# Patient Record
Sex: Female | Born: 1985 | Race: White | Hispanic: No | Marital: Married | Smoking: Former smoker
Health system: Southern US, Community
[De-identification: ages and names within clinical notes are randomized; demographics above are authoritative.]

## PROBLEM LIST (undated history)

## (undated) ENCOUNTER — Inpatient Hospital Stay (HOSPITAL_COMMUNITY): Payer: Self-pay

## (undated) DIAGNOSIS — T8859XA Other complications of anesthesia, initial encounter: Secondary | ICD-10-CM

## (undated) DIAGNOSIS — F419 Anxiety disorder, unspecified: Secondary | ICD-10-CM

## (undated) DIAGNOSIS — R112 Nausea with vomiting, unspecified: Secondary | ICD-10-CM

## (undated) DIAGNOSIS — K219 Gastro-esophageal reflux disease without esophagitis: Secondary | ICD-10-CM

## (undated) DIAGNOSIS — Z9889 Other specified postprocedural states: Secondary | ICD-10-CM

## (undated) DIAGNOSIS — F429 Obsessive-compulsive disorder, unspecified: Secondary | ICD-10-CM

## (undated) DIAGNOSIS — F3281 Premenstrual dysphoric disorder: Secondary | ICD-10-CM

## (undated) DIAGNOSIS — K828 Other specified diseases of gallbladder: Secondary | ICD-10-CM

## (undated) DIAGNOSIS — T4145XA Adverse effect of unspecified anesthetic, initial encounter: Secondary | ICD-10-CM

## (undated) DIAGNOSIS — I890 Lymphedema, not elsewhere classified: Secondary | ICD-10-CM

## (undated) HISTORY — DX: Other specified diseases of gallbladder: K82.8

## (undated) HISTORY — DX: Anxiety disorder, unspecified: F41.9

## (undated) HISTORY — PX: WISDOM TOOTH EXTRACTION: SHX21

---

## 2009-01-20 ENCOUNTER — Encounter: Admission: RE | Admit: 2009-01-20 | Discharge: 2009-01-20 | Payer: Self-pay | Admitting: Podiatry

## 2009-03-02 ENCOUNTER — Ambulatory Visit: Payer: Self-pay | Admitting: Vascular Surgery

## 2009-04-22 ENCOUNTER — Ambulatory Visit: Payer: Self-pay | Admitting: Vascular Surgery

## 2009-05-06 ENCOUNTER — Encounter: Admission: RE | Admit: 2009-05-06 | Discharge: 2009-05-06 | Payer: Self-pay | Admitting: Vascular Surgery

## 2009-05-06 ENCOUNTER — Ambulatory Visit: Payer: Self-pay | Admitting: Vascular Surgery

## 2009-09-30 ENCOUNTER — Encounter: Admission: RE | Admit: 2009-09-30 | Discharge: 2009-09-30 | Payer: Self-pay | Admitting: Obstetrics and Gynecology

## 2009-12-11 ENCOUNTER — Encounter: Admission: RE | Admit: 2009-12-11 | Discharge: 2009-12-11 | Payer: Self-pay | Admitting: Obstetrics and Gynecology

## 2010-04-09 ENCOUNTER — Encounter
Admission: RE | Admit: 2010-04-09 | Discharge: 2010-04-09 | Payer: Self-pay | Source: Home / Self Care | Attending: Obstetrics and Gynecology | Admitting: Obstetrics and Gynecology

## 2010-04-25 ENCOUNTER — Encounter: Payer: Self-pay | Admitting: Vascular Surgery

## 2010-04-25 ENCOUNTER — Encounter: Payer: Self-pay | Admitting: Podiatry

## 2010-04-25 ENCOUNTER — Encounter (HOSPITAL_COMMUNITY): Payer: Self-pay | Admitting: Obstetrics and Gynecology

## 2010-08-17 NOTE — Assessment & Plan Note (Signed)
OFFICE VISIT   MERISSA, RENWICK  DOB:  [birth year]                                       05/06/2009  [id number]#:[id number]   This patient returns for follow-up today after her CT scan of abdomen  and pelvis.  This showed a small 2 cm functional left ovarian cyst as  well as a 2 cm fibroid of the uterus.  There was no evidence of  lymphadenopathy or lymphatic obstruction in the left groin.  There is no  evidence of venous compression from extrinsic structures.  Overall it  was a  normal CT scan.   The patient has had no changes in her lower extremity symptoms.   Today, on physical exam, blood pressure 121/79 in the left arm, heart  rate is 74 and regular, temperature is 97.4.  Lower extremity exam is  unchanged from January 19.   At this point, I believe that primarily this patient's left foot  swelling is primary lymphedema of the etiology.  I discussed with her  again today using physical therapy intermittently if she needs to for  compression therapy and also continue to wear her compression garment.  She will also refer herself to a doctor immediately if she develops any  infections in her foot as it may be difficult for her to clear these.  She will follow up with me on an as-needed basis.     Janetta Hora. Fields, MD  Electronically Signed   CEF/MEDQ  D:  05/06/2009  T:  05/07/2009  Job:  [id number]   cc:   Fanny Bien. Tuchman, D.P.M.

## 2010-08-17 NOTE — Assessment & Plan Note (Signed)
OFFICE VISIT   Mary Flowers, Mary Flowers  DOB:  03-05-1986                                       04/22/2009  [id number]#:[id number]   CHIEF COMPLAINT:  Left foot swelling.   HISTORY OF PRESENT ILLNESS:  The patient is a 25 year old female  referred by Dr. Leeanne Deed for chronic left lower extremity swelling.  The  patient started having swelling around her left foot and left ankle in  May of 2010.  She has had some relief with compression stockings.  She  states that the swelling becomes worse when she is on her feet all day.  She works as a Emergency planning/management officer.  She denies any prior trauma to the left  leg.  She has no prior history of infections in the left leg.  She  currently is going to physical therapy twice a week where she is doing  alternating warm and cold soaks as well as manual compression therapy.  She denies any prior history of DVT.   She has no significant past medical history.  She has no significant  past surgical history.   FAMILY HISTORY:  Is unremarkable.   SOCIAL HISTORY:  She is married, works as a Emergency planning/management officer.  She drinks  two to three alcoholic beverages per week.  She does not smoke.   REVIEW OF SYSTEMS:  Full 12 point review of systems was performed with  the patient.  Please see intake referral form for details regarding  this.   MEDICATIONS:  She takes no medications.   ALLERGIES:  She has no known drug allergies.   PHYSICAL EXAM:  Vital signs:  Blood pressure is 115/72 in the right arm,  heart rate is 81 regular.  Temperature is 97.6.  HEENT:  Unremarkable.  Neck:  Has 2+ carotid pulses with no JVD or bruit.  Chest:  Clear to  auscultation.  Cardiac:  Exam is regular rate and rhythm without murmur.  Abdomen:  Soft, nontender, nondistended.  No obvious masses.  Extremities:  She has no major skeletal deformities.  She does have  nonpitting edema in the left foot, left ankle and up in the left calf.  There is also some edema in the left  thigh with slight asymmetry of the  left thigh compared to the right.  She has 2+ femoral and dorsalis pedis  pulses bilaterally.  Neurological:  Exam shows symmetric upper extremity  and lower extremity motor strength which is 5/5.  Skin:  Has no ulcers  or rashes.  Musculoskeletal:  Exam shows no obvious major deformities.   She had a venous duplex ultrasound on 03/04/2009 which showed no  evidence of DVT.   She also had several pages of records from Dr. Theotis Burrow office which I  reviewed today.  These are remarkable for normal rheumatoid and  inflammatory factor laboratory testing.  She also had an MRI of the left  foot which showed subcutaneous edema but otherwise was really  unremarkable.   ASSESSMENT:  The patient has left lower extremity swelling which is  chronic in nature.  This most likely represents primary lymphedema of  unknown etiology.  However, since she is fairly young in age I believe  that she should have a CT scan of the abdomen and pelvis to rule out any  extrinsic compression of her venous system or lymphatic system.  We have  scheduled her for CT scan of the abdomen and pelvis with IV and oral  contrast.  I will review the findings once the scan is obtained.  If the  scan shows no significant findings then really her primary mainstay  treatment is going to be continued compression therapy with lymphatic  drainage as she is currently doing with physical therapy.  I discussed  all of these findings with the patient today.  I also discussed with her  that she will be at higher risk for low-grade infections in her foot due  to impaired lymphatic drainage.  I also counseled her that this will  probably be a chronic lifelong problem that can be controlled with the  current interventions that she is utilizing.     Janetta Hora. Fields, MD  Electronically Signed   CEF/MEDQ  D:  04/22/2009  T:  04/23/2009  Job:  [id number]   cc:   Fanny Bien. Tuchman, D.P.M.

## 2010-08-17 NOTE — Procedures (Signed)
DUPLEX DEEP VENOUS EXAM - LOWER EXTREMITY   INDICATION:  Left leg / foot edema.   HISTORY:  Edema:  Left calf and foot swelling for months.  Trauma/Surgery:  No.  Pain:  No.  PE:  No.  Previous DVT:  No.  Anticoagulants:  Other:   DUPLEX EXAM:                CFV   SFV   PopV  PTV    GSV                R  L  R  L  R  L  R   L  R  L  Thrombosis    o  o     o     o      o     o  Spontaneous   +  +     +     +      +     +  Phasic        +  +     +     +      +     +  Augmentation  +  +     +     +      +     +  Compressible  +  +     +     +      +     +  Competent     +  +     +     +      +     +   Legend:  + - yes  o - no  p - partial  D - decreased   IMPRESSION:  1. No evidence of deep venous thrombosis noted in the left lower      extremity.  2. A preliminary report was faxed to Dr. Theotis Burrow office on      03/02/2009.    _____________________________  Janetta Hora. Fields, MD   CH/MEDQ  D:  03/03/2009  T:  03/04/2009  Job:  [id number]

## 2011-03-14 ENCOUNTER — Encounter: Payer: Self-pay | Admitting: Internal Medicine

## 2011-03-14 DIAGNOSIS — F429 Obsessive-compulsive disorder, unspecified: Secondary | ICD-10-CM

## 2011-03-14 DIAGNOSIS — F419 Anxiety disorder, unspecified: Secondary | ICD-10-CM | POA: Insufficient documentation

## 2011-03-14 DIAGNOSIS — F3281 Premenstrual dysphoric disorder: Secondary | ICD-10-CM | POA: Insufficient documentation

## 2011-03-14 DIAGNOSIS — G4700 Insomnia, unspecified: Secondary | ICD-10-CM | POA: Insufficient documentation

## 2011-03-23 ENCOUNTER — Encounter (INDEPENDENT_AMBULATORY_CARE_PROVIDER_SITE_OTHER): Payer: 59 | Admitting: Internal Medicine

## 2011-03-23 DIAGNOSIS — Z0000 Encounter for general adult medical examination without abnormal findings: Secondary | ICD-10-CM

## 2011-03-23 DIAGNOSIS — I890 Lymphedema, not elsewhere classified: Secondary | ICD-10-CM

## 2011-03-23 DIAGNOSIS — F341 Dysthymic disorder: Secondary | ICD-10-CM

## 2011-04-06 ENCOUNTER — Encounter: Payer: Self-pay | Admitting: Emergency Medicine

## 2011-04-06 ENCOUNTER — Emergency Department (HOSPITAL_BASED_OUTPATIENT_CLINIC_OR_DEPARTMENT_OTHER)
Admission: EM | Admit: 2011-04-06 | Discharge: 2011-04-06 | Disposition: A | Discharge status: Home / Self Care | Payer: Worker's Compensation | Attending: Emergency Medicine | Admitting: Emergency Medicine

## 2011-04-06 DIAGNOSIS — S0003XA Contusion of scalp, initial encounter: Secondary | ICD-10-CM | POA: Insufficient documentation

## 2011-04-06 DIAGNOSIS — Y92410 Unspecified street and highway as the place of occurrence of the external cause: Secondary | ICD-10-CM | POA: Insufficient documentation

## 2011-04-06 DIAGNOSIS — F429 Obsessive-compulsive disorder, unspecified: Secondary | ICD-10-CM | POA: Insufficient documentation

## 2011-04-06 HISTORY — DX: Anxiety disorder, unspecified: F41.9

## 2011-04-06 HISTORY — DX: Obsessive-compulsive disorder, unspecified: F42.9

## 2011-04-06 HISTORY — DX: Premenstrual dysphoric disorder: F32.81

## 2011-04-06 MED ORDER — IBUPROFEN 600 MG PO TABS: 600.0000 mg | ORAL_TABLET | Freq: Four times a day (QID) | ORAL | Status: AC | PRN

## 2011-04-06 MED ORDER — METHOCARBAMOL 500 MG PO TABS: ORAL_TABLET | ORAL | Status: AC

## 2011-04-06 MED ORDER — IBUPROFEN 400 MG PO TABS
400.0000 mg | ORAL_TABLET | Freq: Once | ORAL | Status: AC
  Administered 2011-04-06: 400 mg via ORAL
  Filled 2011-04-06: qty 1

## 2011-04-06 MED ORDER — HYDROCODONE-ACETAMINOPHEN 5-325 MG PO TABS
1.0000 | ORAL_TABLET | Freq: Once | ORAL | Status: AC
  Administered 2011-04-06: 1 via ORAL
  Filled 2011-04-06: qty 1
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
## 2011-05-05 ENCOUNTER — Ambulatory Visit (INDEPENDENT_AMBULATORY_CARE_PROVIDER_SITE_OTHER): Payer: 59 | Admitting: Internal Medicine

## 2011-05-05 VITALS — BP 100/62 | HR 92 | Temp 97.2°F | Resp 18 | Ht 67.0 in | Wt 163.0 lb

## 2011-05-05 DIAGNOSIS — E860 Dehydration: Secondary | ICD-10-CM

## 2011-05-05 DIAGNOSIS — F429 Obsessive-compulsive disorder, unspecified: Secondary | ICD-10-CM

## 2011-05-05 DIAGNOSIS — K5289 Other specified noninfective gastroenteritis and colitis: Secondary | ICD-10-CM

## 2011-05-05 DIAGNOSIS — K529 Noninfective gastroenteritis and colitis, unspecified: Secondary | ICD-10-CM

## 2011-05-05 MED ORDER — SODIUM CHLORIDE 0.9 % IV SOLN
4.0000 mg | Freq: Once | INTRAVENOUS | Status: AC
  Administered 2011-05-05: 4 mg via INTRAVENOUS

## 2011-05-05 MED ORDER — ONDANSETRON HCL 4 MG PO TABS: 4.0000 mg | ORAL_TABLET | Freq: Three times a day (TID) | ORAL | Status: AC | PRN

## 2011-05-05 MED ORDER — ONDANSETRON 4 MG PO TBDP: 8.0000 mg | ORAL_TABLET | Freq: Once | ORAL | Status: DC

## 2011-05-05 MED ORDER — FLUOXETINE HCL 40 MG PO CAPS: 80.0000 mg | ORAL_CAPSULE | Freq: Every day | ORAL | Status: DC

## 2011-05-05 MED ORDER — SODIUM CHLORIDE 0.45 % IV SOLN: INTRAVENOUS | Status: DC

## 2011-05-05 NOTE — Progress Notes (Signed)
Mary Flowers came in today with the acute onset of nausea vomiting and diarrhea early this morning. Her husband had the same illness yesterday. There is no known bad food or travel associated with this illness. She has not had fever and she has had no urinary tract symptoms. She has mild abdominal pain that is crampy in nature but goes away with each episode of diarrhea. Her nausea returns almost immediately after vomiting. She is unable to take any liquids by mouth at this point. She feels very weak and dehydrated.  Review of systems-there are no other complaints outside of the gastrointestinal system.  Physical examination. She appears ill but not in acute distress. HEENT clear including no lymphadenopathy. Heart and lungs are normal.  The abdomen is soft and nondistended with no organomegaly. She is mildly tender to deep palpation throughout. The bowel sounds are hyperactive.  Mucous membranes are dry and the skin has mild tenting.  Impression  Problem #1 acute gastroenteritis  Problem #2 mild dehydration  An IV was started and she was given 1000 cc of half normal saline. She felt much improved. She also was given 8 mg of Zofran IV the time of initiating fluids.  She was given a prescription for Zofran 4 mg tablets to take every 8 hours at home if needed.  She was advised to start with clear liquids and advance her diet as tolerated.  She is to check that in the morning at 8 AM if not responding to treatment.  Problem #3 obsessive-compulsive disorder  Since her change from Cymbalta to Prozac she has no symptoms of depression and is or spotting well. She expresses a desire to go ahead and increase her dose to 80 mg because she still has some intrusive thoughts that she is anxious all the time. A prescription was sent to her pharmacy for the new dose and she will follow up with me in one month as planned.

## 2011-05-05 NOTE — Patient Instructions (Signed)
Dehydration Dehydration is the reduction of water and fluid from the body to a level below that required for proper functioning. CAUSES  Dehydration occurs when there is excessive fluid loss from the body or when loss of normal fluids is not adequately replaced. Loss of fluids occurs in vomiting, diarrhea, excessive sweating, excessive urine output, or excessive loss of fluid from the lungs (as occurs in fever or in patients on a ventilator).  Inadequate fluid replacement occurs with nausea or decreased appetite due to illness, sore throat, or mouth pain.  SYMPTOMS  Mild dehydration Thirst (infants and young children may not be able to tell you they are thirsty).  Dry lips.  Slightly dry mouth membranes.  Moderate dehydration Very dry mouth membranes.  Sunken eyes.  Sunken soft spot (fontanelle) on infant's head.  Skin does not bounce back quickly when lightly pinched and released.  Decreased urine production.  Decreased tear production.  Severe dehydration Rapid, weak pulse (more than 100 beats per minute at rest).  Cold hands and feet.  Loss of ability to sweat in spite of heat and temperature.  Rapid breathing.  Blue lips.  Confusion, lethargy, difficult to arouse.  Minimal urine production.  No tears.  DIAGNOSIS  Your caregiver will diagnose dehydration based on your symptoms and your exam. Blood and urine tests will help confirm the diagnosis. The diagnostic evaluation should also identify the cause of dehydration. PREVENTION  The body depends on a proper balance of fluid and salts (electrolytes) for normal function. Adequate fluid intake in the presence of illness or other stresses (such as extreme exercise) is important.  TREATMENT  Mild dehydration is safe to self-treat for most ages as long as it does not worsen. Contact your caregiver for even mild dehydration in infants and the elderly.  In teenagers and adults with moderate dehydration, careful home treatment (as outlined  below) can be safe. Phone contact with a caregiver is advised. Children under 19 years of age with moderate dehydration should see a caregiver.  If you or your child is severely dehydrated, go to a hospital for treatment. Intravenous (IV) fluids will quickly reverse dehydration and are often lifesaving in young children, infants, and elderly persons.  HOME CARE INSTRUCTIONS  Small amounts of fluids should be taken frequently. Large amounts at one time may not be tolerated. Plain water may be harmful in infants and the elderly. Oral rehydration solutions (ORS) are available at pharmacies and grocery stores. ORS replaces water and important electrolytes in proper proportions. Sports drinks are not as effective as ORS and may be harmful because the sugar can make diarrhea worse. As a general guideline for children, replace any new fluid losses from diarrhea and/or vomiting with ORS as follows:  If your child weighs 22 pounds or under (10 kg or less), give 60-120 mL (1/4-1/2 cup or 2-4 ounces) of ORS for each diarrheal stool or vomiting episode.  If your child weighs more than 22 pounds (more than 10 kg), give 120-240 mL (1/2-1 cup or 4-8 ounces) of ORS for each diarrheal stool or vomiting episode.  If your child is vomiting, it may be helpful to give the above ORS replacement in 5 mL (1 teaspoon) amounts every 5 minutes and increase as tolerated.  While correcting for dehydration, children should eat normally. However, foods high in sugar should be avoided because they may worsen diarrhea. Large amounts of carbonated soft drinks, juice, gelatin desserts, and other highly sugared drinks should be avoided.  After correction of  dehydration, other liquids that are appealing to the child may be added. Children should drink small amounts of fluids frequently and fluids should be increased as tolerated. Children should drink enough fluids to keep urine clear or pale yellow.  Adults should eat normally while drinking  more fluids than usual. Drink small amounts of fluids frequently and increase the amount as tolerated. Drink enough fluids to keep urine clear or pale yellow. Broths, weak decaffeinated tea, lemon-lime soft drinks (allowed to go flat), and ORS replace fluids and electrolytes.  Avoid:  Carbonated drinks.  Juice.  Extremely hot or cold fluids.  Caffeine drinks.  Fatty, greasy foods.  Alcohol.  Tobacco.  Too much intake of anything at one time.  Gelatin desserts.  Probiotics are active cultures of beneficial bacteria. They may lessen the amount and number of diarrheal stools in adults. Probiotics can be found in yogurt with active cultures and in supplements.  Wash your hands well to avoid spreading germs (bacteria) and viruses.  Antidiarrheal medicines are not recommended for infants and children.  Only take over-the-counter or prescription medicines for pain, discomfort, or fever as directed by your caregiver. Do not give aspirin to children.  For adults with dehydration, ask your caregiver if you should continue all prescribed and over-the-counter medicines.  If your caregiver has given you a follow-up appointment, it is very important to keep that appointment. Not keeping the appointment could result in a lasting (chronic) or permanent injury and disability. If there is any problem keeping the appointment, you must call to reschedule.  SEEK IMMEDIATE MEDICAL CARE IF:  You are unable to keep fluids down or other symptoms become worse despite treatment.  Vomiting or diarrhea develops and becomes persistent.  There is vomiting of blood or green matter (bile).  There is blood in the stool or the stools are black and tarry.  There is no urine output in 6 to 8 hours or there is only a small amount of very dark urine.  Abdominal pain develops, increases, or localizes.  You or your child has an oral temperature above 102 F (38.9 C), not controlled by medicine.  Your baby is older than 3 months  with a rectal temperature of 102.53F (38.9 C) or higher.  Your baby is 37 months old or younger with a rectal temperature of 100.4 F (38 C) or higher.  You develop excessive weakness, dizziness, fainting, or extreme thirst.  You develop a rash, stiff neck, severe headache, or you become irritable, sleepy, or difficult to awaken.  MAKE SURE YOU:  Understand these instructions.  Will watch your condition.  Will get help right away if you are not doing well or get worse.  Document Released: 03/21/2005 Document Revised: 10/04/2010 Document Reviewed: 02/17/2009 [facility] Patient Information 2012 [place], [place].Norovirus Infection Norovirus illness is caused by a viral infection. The term norovirus refers to a group of viruses. Any of those viruses can cause norovirus illness. This illness is often referred to by other names such as viral gastroenteritis, stomach flu, and food poisoning. Anyone can get a norovirus infection. People can have the illness multiple times during their lifetime. CAUSES  Norovirus is found in the stool or vomit of infected people. It is easily spread from person to person (contagious). People with norovirus are contagious from the moment they begin feeling ill. They may remain contagious for as long as 3 days to 2 weeks after recovery. People can become infected with the virus in several ways. This includes:  Eating  food or drinking liquids that are contaminated with norovirus.   Touching surfaces or objects contaminated with norovirus, and then placing your hand in your mouth.   Having direct contact with a person who is infected and shows symptoms. This may occur while caring for someone with illness or while sharing foods or eating utensils with someone who is ill.  SYMPTOMS  Symptoms usually begin 1 to 2 days after ingestion of the virus. Symptoms may include:  Nausea.   Vomiting.   Diarrhea.   Stomach cramps.   Low-grade fever.   Chills.   Headache.    Muscle aches.   Tiredness.  Most people with norovirus illness get better within 1 to 2 days. Some people become dehydrated because they cannot drink enough liquids to replace those lost from vomiting and diarrhea. This is especially true for young children, the elderly, and others who are unable to care for themselves. DIAGNOSIS  Diagnosis is based on your symptoms and exam. Currently, only state public health laboratories have the ability to test for norovirus in stool or vomit. TREATMENT  No specific treatment exists for norovirus infections. No vaccine is available to prevent infections. Norovirus illness is usually brief in healthy people. If you are ill with vomiting and diarrhea, you should drink enough water and fluids to keep your urine clear or pale yellow. Dehydration is the most serious health effect that can result from this infection. By drinking oral rehydration solution (ORS), people can reduce their chance of becoming dehydrated. There are many commercially available pre-made and powdered ORS designed to safely rehydrate people. These may be recommended by your caregiver. Replace any new fluid losses from diarrhea or vomiting with ORS as follows:  If your child weighs 10 kg or less (22 lb or less), give 60 to 120 ml ( to  cup or 2 to 4 oz) of ORS for each diarrheal stool or vomiting episode.   If your child weighs more than 10 kg (more than 22 lb), give 120 to 240 ml ( to 1 cup or 4 to 8 oz) of ORS for each diarrheal stool or vomiting episode.  HOME CARE INSTRUCTIONS   Follow all your caregiver's instructions.   Avoid sugar-free and alcoholic drinks while ill.   Only take over-the-counter or prescription medicines for pain, vomiting, diarrhea, or fever as directed by your caregiver.  You can decrease your chances of coming in contact with norovirus or spreading it by following these steps:  Frequently wash your hands, especially after using the toilet, changing diapers,  and before eating or preparing food.   Carefully wash fruits and vegetables. Cook shellfish before eating them.   Do not prepare food for others while you are infected and for at least 3 days after recovering from illness.   Thoroughly clean and disinfect contaminated surfaces immediately after an episode of illness using a bleach-based household cleaner.   Immediately remove and wash clothing or linens that may be contaminated with the virus.   Use the toilet to dispose of any vomit or stool. Make sure the surrounding area is kept clean.   Food that may have been contaminated by an ill person should be discarded.  SEEK IMMEDIATE MEDICAL CARE IF:   You develop symptoms of dehydration that do not improve with fluid replacement. This may include:   Excessive sleepiness.   Lack of tears.   Dry mouth.   Dizziness when standing.   Weak pulse.  Document Released: 06/11/2002 Document Revised: 12/01/2010 Document  Reviewed: 07/13/2009 [employer] Patient Information 2012 [place], [place].

## 2011-05-23 ENCOUNTER — Telehealth: Payer: Self-pay

## 2011-05-23 NOTE — Telephone Encounter (Signed)
.  [facility] PT WOULD LIKE A COPY OF HER MEDICAL RECORDS, SHE IS SUING THE DRUG COMPANY AND HER LAWYER WOULD LIKE RECORDS PLEASE CALL [phone] WHEN READY FOR P/U

## 2011-06-27 ENCOUNTER — Ambulatory Visit (INDEPENDENT_AMBULATORY_CARE_PROVIDER_SITE_OTHER): Payer: 59 | Admitting: Internal Medicine

## 2011-06-27 VITALS — BP 108/80 | HR 60 | Temp 98.6°F | Resp 12 | Ht 68.0 in | Wt 158.0 lb

## 2011-06-27 DIAGNOSIS — B354 Tinea corporis: Secondary | ICD-10-CM

## 2011-06-27 DIAGNOSIS — F429 Obsessive-compulsive disorder, unspecified: Secondary | ICD-10-CM

## 2011-06-27 MED ORDER — FLUOXETINE HCL 40 MG PO CAPS: 80.0000 mg | ORAL_CAPSULE | Freq: Every day | ORAL | Status: DC

## 2011-06-27 MED ORDER — CLONAZEPAM 1 MG PO TABS: 1.0000 mg | ORAL_TABLET | Freq: Two times a day (BID) | ORAL | Status: DC | PRN

## 2011-06-27 MED ORDER — CLOTRIMAZOLE-BETAMETHASONE 1-0.05 % EX CREA: TOPICAL_CREAM | Freq: Two times a day (BID) | CUTANEOUS | Status: AC
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
## 2011-07-27 ENCOUNTER — Ambulatory Visit: Payer: 59 | Admitting: Internal Medicine

## 2011-10-05 ENCOUNTER — Ambulatory Visit (INDEPENDENT_AMBULATORY_CARE_PROVIDER_SITE_OTHER): Payer: 59 | Admitting: Family Medicine

## 2011-10-05 VITALS — BP 124/82 | HR 66 | Temp 98.6°F | Resp 16 | Ht 67.0 in | Wt 169.0 lb

## 2011-10-05 DIAGNOSIS — Z23 Encounter for immunization: Secondary | ICD-10-CM

## 2011-10-05 DIAGNOSIS — M6740 Ganglion, unspecified site: Secondary | ICD-10-CM

## 2011-10-05 MED ORDER — TETANUS-DIPHTH-ACELL PERTUSSIS 5-2.5-18.5 LF-MCG/0.5 IM SUSP
0.5000 mL | Freq: Once | INTRAMUSCULAR | Status: AC
  Administered 2011-10-05: 0.5 mL via INTRAMUSCULAR

## 2011-10-05 NOTE — Progress Notes (Signed)
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 She's going back to school to study psychology in the fall.  In addition she has a large ganglion cyst on her left wrist. She's had since childhood. It does not bother her now although it's been inflamed periodically.  Objective: Left wrist shows to 1-1/2 cm fluctuant cyst at the joint line at the base of the thumb.  Assessment: Patient unwilling to have again this is felt this point soreness in his immunizations.  Plan: call for ortho referral prn tdap

## 2011-10-05 NOTE — Patient Instructions (Addendum)
Ganglion A ganglion is a swelling under the skin that is filled with a thick, jelly-like substance. It is a synovial cyst. This is caused by a break (rupture) of the joint lining from the joint space. A ganglion often occurs near an area of repeated minor trauma (damage caused by an accident). Trauma may also be a repetitive movement at work or in a sport. TREATMENT   It often goes away without treatment. It may reappear later. Sometimes a ganglion may need to be surgically removed. Often they are drained and injected with a steroid. Sometimes they respond to:  Rest.   Splinting.  HOME CARE INSTRUCTIONS    Your caregiver will decide the best way of treating your ganglion. Do not try to break the ganglion yourself by pressing on it, poking it with a needle, or hitting it with a heavy object.   Use medications as directed.  SEEK MEDICAL CARE IF:    The ganglion becomes larger or more painful.   You have increased redness or swelling.   You have weakness or numbness in your hand or wrist.  MAKE SURE YOU:    Understand these instructions.   Will watch your condition.   Will get help right away if you are not doing well or get worse.  Document Released: 03/18/2000 Document Revised: 03/10/2011 Document Reviewed: 05/15/2007 Exit[employer] Patient Information 2012 [employer]. 

## 2011-10-21 ENCOUNTER — Other Ambulatory Visit: Payer: Self-pay

## 2011-10-21 ENCOUNTER — Other Ambulatory Visit: Payer: Self-pay | Admitting: Internal Medicine

## 2011-12-06 ENCOUNTER — Other Ambulatory Visit: Payer: Self-pay | Admitting: Family Medicine

## 2011-12-06 ENCOUNTER — Telehealth: Payer: Self-pay | Admitting: Physician Assistant

## 2011-12-06 MED ORDER — CLONAZEPAM 1 MG PO TABS: 1.0000 mg | ORAL_TABLET | Freq: Two times a day (BID) | ORAL | Status: DC | PRN

## 2011-12-06 NOTE — Telephone Encounter (Signed)
Fax request for clonazepam was already addressed electronically today.

## 2011-12-22 ENCOUNTER — Other Ambulatory Visit: Payer: Self-pay | Admitting: Internal Medicine

## 2012-01-06 ENCOUNTER — Other Ambulatory Visit: Payer: Self-pay | Admitting: Physician Assistant

## 2012-01-25 ENCOUNTER — Other Ambulatory Visit: Payer: Self-pay | Admitting: Physician Assistant

## 2012-02-09 ENCOUNTER — Other Ambulatory Visit: Payer: Self-pay | Admitting: Internal Medicine

## 2012-02-11 ENCOUNTER — Other Ambulatory Visit: Payer: Self-pay | Admitting: *Deleted

## 2012-02-16 ENCOUNTER — Telehealth: Payer: Self-pay | Admitting: *Deleted

## 2012-02-16 NOTE — Telephone Encounter (Signed)
Denied - patient needs an OV

## 2012-02-16 NOTE — Telephone Encounter (Signed)
Called patient left message

## 2012-02-16 NOTE — Telephone Encounter (Signed)
PHARMACY REQUESTS REFILL FOR FLUOXETINE 40MG   2 CAPS QD. LAST FILL 02/09/12.  PHARMACY SENDING OUT REQUESTS IN ADVANCE TO BE PROACTIVE

## 2012-02-16 NOTE — Telephone Encounter (Signed)
DISREGARD LAST MESSAGE WRONG MEDICATION

## 2012-02-16 NOTE — Telephone Encounter (Signed)
PHARMACY REQUEST RX REFILL ON PROVENTIL HFA  2 PUFFS EVERY 6 HRS PRN WHEEZING.  LAST FILL 10/29/11

## 2012-03-13 ENCOUNTER — Other Ambulatory Visit: Payer: Self-pay | Admitting: Internal Medicine

## 2012-03-13 NOTE — Telephone Encounter (Signed)
Needs OV.  

## 2012-03-16 ENCOUNTER — Other Ambulatory Visit: Payer: Self-pay | Admitting: Internal Medicine

## 2012-03-17 NOTE — Telephone Encounter (Signed)
Pt needs OV, not seen since March 2013, 4th notice

## 2012-03-30 ENCOUNTER — Telehealth: Payer: Self-pay | Admitting: *Deleted

## 2012-03-30 MED ORDER — NORETHIN-ETH ESTRADIOL-FE 0.8-25 MG-MCG PO CHEW: 1.0000 | CHEWABLE_TABLET | Freq: Every day | ORAL | Status: DC

## 2012-03-30 NOTE — Telephone Encounter (Signed)
Pharmacy requesting a refill on generess fe chewable tabs.  Last refill 02/29/12

## 2012-03-30 NOTE — Telephone Encounter (Signed)
Rx sent. Please advise the patient that she needs an breast exam and pelvic exam.

## 2012-04-01 NOTE — Telephone Encounter (Signed)
Called pt left mssg for her to call back

## 2012-04-02 NOTE — Telephone Encounter (Signed)
lmom that pt needs ov before anymore refills

## 2012-04-04 ENCOUNTER — Ambulatory Visit (INDEPENDENT_AMBULATORY_CARE_PROVIDER_SITE_OTHER): Payer: 59 | Admitting: Internal Medicine

## 2012-04-04 VITALS — BP 129/79 | HR 76 | Temp 99.2°F | Resp 16 | Ht 68.75 in | Wt 170.0 lb

## 2012-04-04 DIAGNOSIS — F411 Generalized anxiety disorder: Secondary | ICD-10-CM

## 2012-04-04 DIAGNOSIS — F429 Obsessive-compulsive disorder, unspecified: Secondary | ICD-10-CM

## 2012-04-04 DIAGNOSIS — J019 Acute sinusitis, unspecified: Secondary | ICD-10-CM

## 2012-04-04 DIAGNOSIS — J329 Chronic sinusitis, unspecified: Secondary | ICD-10-CM

## 2012-04-04 DIAGNOSIS — F419 Anxiety disorder, unspecified: Secondary | ICD-10-CM

## 2012-04-04 MED ORDER — FLUOXETINE HCL 20 MG PO TABS: 60.0000 mg | ORAL_TABLET | Freq: Every day | ORAL | Status: DC

## 2012-04-04 MED ORDER — AMOXICILLIN 875 MG PO TABS: 875.0000 mg | ORAL_TABLET | Freq: Two times a day (BID) | ORAL | Status: DC

## 2012-04-04 MED ORDER — FLUCONAZOLE 150 MG PO TABS: 150.0000 mg | ORAL_TABLET | Freq: Once | ORAL | Status: DC

## 2012-04-04 MED ORDER — CLONAZEPAM 0.5 MG PO TABS: 0.5000 mg | ORAL_TABLET | Freq: Two times a day (BID) | ORAL | Status: DC | PRN

## 2012-04-04 NOTE — Progress Notes (Signed)
  Subjective:    Patient ID: Mary Flowers, female    DOB: 1985/09/29, 27 y.o.   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 Review of Systems No weight loss or fatigue No chest pain or palpitations No genitourinary problems No headaches or vision changes     Objective:   Physical Exam Vital signs stable except weight HEENT: TMs clear/nares with purulent discharge on the left/tender small ulceration on the side of the tongue and the posterior left pharynx/tender left maxillary to percussion No anterior cervical nodes/teeth in good repair/no gum abscesses Heart regular without murmur Lungs clear Abdomen supple Neurological intact       Assessment & Plan:  Problem #1 OCD Problem #2 history of anxiety and insomnia now stable Problem 3 history of PMDD stable Problem #4 other symptoms likely related to the combination of her dose of Prozac, and discontinuation of Klonopin, and a viral upperRI that has progressed to a sinus infection  Decrease Prozac to 60 mg Use occasional doses of 0.5 or .25 Klonopin over the next few weeks as needed, then discontinue Followup 2 weeks if not well otherwise in 6 months  Meds ordered this encounter  Medications  . FLUoxetine (PROZAC) 20 MG tablet    Sig: Take 3 tablets (60 mg total) by mouth daily.    Dispense:  270 tablet    Refill:  3  . clonazePAM (KLONOPIN) 0.5 MG tablet    Sig: Take 1 tablet (0.5 mg total) by mouth 2 (two) times daily as needed for  anxiety.    Dispense:  10 tablet    Refill:  0  . amoxicillin (AMOXIL) 875 MG tablet    Sig: Take 1 tablet (875 mg total) by mouth 2 (two) times daily.    Dispense:  20 tablet    Refill:  0  . fluconazole (DIFLUCAN) 150 MG tablet    Sig: Take 1 tablet (150 mg total) by mouth once.    Dispense:  1 tablet    Refill:  1

## 2012-04-18 ENCOUNTER — Other Ambulatory Visit: Payer: Self-pay | Admitting: Physician Assistant

## 2012-04-20 ENCOUNTER — Telehealth: Payer: Self-pay

## 2012-04-20 NOTE — Telephone Encounter (Signed)
No, I was trying to forward to PA . I have advised patient to take Mucinex and will call back if any further is recommended.

## 2012-04-20 NOTE — Telephone Encounter (Signed)
Did we mean to send this to Dr. Merla Riches?  Will forward. Thanks! JC

## 2012-04-20 NOTE — Telephone Encounter (Signed)
Called patient she states she has been having headache and dizziness, was treated for sinus infection. She still has nasal congestion and I have advised her to start taking Mucinex. Please advise.

## 2012-04-20 NOTE — Telephone Encounter (Signed)
Patient came in for sinus infection, too antibiotics and still has symptoms so she is wondering if there is something else we can prescribe.

## 2012-04-27 ENCOUNTER — Ambulatory Visit: Payer: 59

## 2012-04-27 ENCOUNTER — Telehealth: Payer: Self-pay

## 2012-04-27 DIAGNOSIS — F429 Obsessive-compulsive disorder, unspecified: Secondary | ICD-10-CM

## 2012-04-27 NOTE — Telephone Encounter (Signed)
Patient called last week to ask about getting other antibiotics for sinus infection. Amy talked to her about trying mucinex and would wait to see what PAs say. She has not had further calls and still wants to know what her options are. She is here and would rather not wait to be seen.

## 2012-04-30 NOTE — Telephone Encounter (Signed)
Patient requests this message be sent to Dr Merla Riches, please advise on the dosage of fluoxetine, she decreased to 60 mg but now is asking for increase back to 80mg , please advise

## 2012-04-30 NOTE — Telephone Encounter (Signed)
If the symptoms have persisted this long and are not improving with Mucinex and the antibiotic she had at the beginning of the month, she needs to be re-evaluated.

## 2012-04-30 NOTE — Telephone Encounter (Signed)
Called patient to advise. She states she went to another doctor already. She now is asking about Fluoxetine, wants to know if she can increase the Fluoxetine back to 80mg  dose, she recently decreased to 60mg .

## 2012-04-30 NOTE — Telephone Encounter (Signed)
I would advise her to return and speak with Dr. Merla Riches about adjusting fluoxetine dose.

## 2012-05-01 MED ORDER — FLUOXETINE HCL 40 MG PO CAPS: 40.0000 mg | ORAL_CAPSULE | Freq: Every day | ORAL | Status: DC

## 2012-05-01 NOTE — Telephone Encounter (Signed)
This dose was effective for OCD in past w/out side effects Sent 2x40mg  qd to pharm to fill after uses up current 20mg s at 4x20mg   Meds ordered this encounter  Medications  . FLUoxetine (PROZAC) 40 MG capsule    Sig: Take 1 capsule (40 mg total) by mouth daily.    Dispense:  180 capsule    Refill:  2

## 2012-05-02 MED ORDER — FLUOXETINE HCL 40 MG PO CAPS: 80.0000 mg | ORAL_CAPSULE | Freq: Every day | ORAL | Status: DC

## 2012-05-02 NOTE — Telephone Encounter (Signed)
LMOM for pt notifying her that new Rx was sent in for her to take 2 tablets of Prozac 40 mg QD, and that she may take 4 tabs of her 20 mg she already has until they run out. Asked for CB w/any ?s.

## 2012-05-14 ENCOUNTER — Other Ambulatory Visit: Payer: Self-pay | Admitting: Physician Assistant

## 2012-11-22 ENCOUNTER — Ambulatory Visit (INDEPENDENT_AMBULATORY_CARE_PROVIDER_SITE_OTHER): Payer: 59 | Admitting: Family Medicine

## 2012-11-22 VITALS — BP 102/64 | HR 81 | Temp 98.1°F | Resp 16 | Ht 68.0 in | Wt 147.0 lb

## 2012-11-22 DIAGNOSIS — K2970 Gastritis, unspecified, without bleeding: Secondary | ICD-10-CM

## 2012-11-22 DIAGNOSIS — R1013 Epigastric pain: Secondary | ICD-10-CM

## 2012-11-22 DIAGNOSIS — G4700 Insomnia, unspecified: Secondary | ICD-10-CM

## 2012-11-22 DIAGNOSIS — K2990 Gastroduodenitis, unspecified, without bleeding: Secondary | ICD-10-CM

## 2012-11-22 MED ORDER — RANITIDINE HCL 150 MG PO TABS: 150.0000 mg | ORAL_TABLET | Freq: Every day | ORAL | Status: DC

## 2012-11-22 MED ORDER — HYDROXYZINE HCL 25 MG PO TABS: 25.0000 mg | ORAL_TABLET | Freq: Every evening | ORAL | Status: DC | PRN

## 2012-11-22 MED ORDER — OMEPRAZOLE 20 MG PO CPDR: 20.0000 mg | DELAYED_RELEASE_CAPSULE | Freq: Every day | ORAL | Status: DC

## 2012-11-22 NOTE — Patient Instructions (Addendum)
1.  AVOID IBUPROFEN AT LEAST FOR THE NEXT TWO WEEKS AND THEN USE SPARINGLY. 2.  LIMIT CAFFEINE AND SPICY FOODS. 3.  RETURN FOR WORSENING PAIN OR NO IMPROVEMENT IN ONE MONTH ON MEDICATION.   Gastritis, Adult Gastritis is soreness and swelling (inflammation) of the lining of the stomach. Gastritis can develop as a sudden onset (acute) or long-term (chronic) condition. If gastritis is not treated, it can lead to stomach bleeding and ulcers. CAUSES  Gastritis occurs when the stomach lining is weak or damaged. Digestive juices from the stomach then inflame the weakened stomach lining. The stomach lining may be weak or damaged due to viral or bacterial infections. One common bacterial infection is the Helicobacter pylori infection. Gastritis can also result from excessive alcohol consumption, taking certain medicines, or having too much acid in the stomach.  SYMPTOMS  In some cases, there are no symptoms. When symptoms are present, they may include:  Pain or a burning sensation in the upper abdomen.  Nausea.  Vomiting.  An uncomfortable feeling of fullness after eating. DIAGNOSIS  Your caregiver may suspect you have gastritis based on your symptoms and a physical exam. To determine the cause of your gastritis, your caregiver may perform the following:  Blood or stool tests to check for the H pylori bacterium.  Gastroscopy. A thin, flexible tube (endoscope) is passed down the esophagus and into the stomach. The endoscope has a light and camera on the end. Your caregiver uses the endoscope to view the inside of the stomach.  Taking a tissue sample (biopsy) from the stomach to examine under a microscope. TREATMENT  Depending on the cause of your gastritis, medicines may be prescribed. If you have a bacterial infection, such as an H pylori infection, antibiotics may be given. If your gastritis is caused by too much acid in the stomach, H2 blockers or antacids may be given. Your caregiver may  recommend that you stop taking aspirin, ibuprofen, or other nonsteroidal anti-inflammatory drugs (NSAIDs). HOME CARE INSTRUCTIONS  Only take over-the-counter or prescription medicines as directed by your caregiver.  If you were given antibiotic medicines, take them as directed. Finish them even if you start to feel better.  Drink enough fluids to keep your urine clear or pale yellow.  Avoid foods and drinks that make your symptoms worse, such as:  Caffeine or alcoholic drinks.  Chocolate.  Peppermint or mint flavorings.  Garlic and onions.  Spicy foods.  Citrus fruits, such as oranges, lemons, or limes.  Tomato-based foods such as sauce, chili, salsa, and pizza.  Fried and fatty foods.  Eat small, frequent meals instead of large meals. SEEK IMMEDIATE MEDICAL CARE IF:   You have black or dark red stools.  You vomit blood or material that looks like coffee grounds.  You are unable to keep fluids down.  Your abdominal pain gets worse.  You have a fever.  You do not feel better after 1 week.  You have any other questions or concerns. MAKE SURE YOU:  Understand these instructions.  Will watch your condition.  Will get help right away if you are not doing well or get worse. Document Released: 03/15/2001 Document Revised: 09/20/2011 Document Reviewed: 05/04/2011 [facility] Patient Information 2014 [place], [place].

## 2012-11-22 NOTE — Progress Notes (Signed)
[address]   [place], [place]  [address]   [phone]  Subjective:    Patient ID: Mary Flowers, female    DOB: [birth year], 27 y.o.   MRN: [id number]  HPI This 27 y.o. female presents for evaluation of epigastric pain.  Worried about stomach ulcer.  Took Fluoxetine after a small meal; suffered with stomach burning all night long.  Improved yesterday; ate [place] food last night; has also been taking Ibuprofen for menses, recurrent stomach burning.  Onset four days ago.  +nausea; stomach is really burning; no vomiting; no black stools; no bloody stools.  Daily b.m.  No diarrhea; no constipation.  No dysuria, hematuria, frequency.  LMP currently.  Takes Ibuprofen less often; OCP helps with cramping.  Has suffered with leg pain due to swelling; this has interfered with sleep; taking 3 Ibuprofen every four hours this week.  Spicy foods a lot.  Caffeine regularly; drinks one serving per day.  +fatigued; super tired; shots of expresso this week.  Alcohol weekends.  Took a Tums this week.  Eating can make pain worse.  Similar symptoms in the past but not as severe.  No heartburn or indigestion.   2. Insomnia: has weaned self off sleep medication but struggling to sleep; would like non-habit forming medication for insomnia.  PCP: Merla Riches.   Review of Systems  Constitutional: Negative for chills, diaphoresis and fatigue.  Gastrointestinal: Positive for nausea and abdominal pain. Negative for vomiting, diarrhea, constipation, blood in stool, abdominal distention, anal bleeding and rectal pain.  Genitourinary: Negative for dysuria, urgency, frequency, hematuria, flank pain and pelvic pain.  Psychiatric/Behavioral: Positive for sleep disturbance and dysphoric mood. The patient is nervous/anxious.        Objective:   Physical Exam  Nursing note and vitals reviewed. Constitutional: She is oriented to person, place, and time. She appears well-developed and well-nourished. No distress.  HENT:  Head:  Normocephalic and atraumatic.  Mouth/Throat: Oropharynx is clear and moist.  Eyes: Conjunctivae are normal.  Neck: Neck supple. No thyromegaly present.  Cardiovascular: Normal rate, regular rhythm and normal heart sounds.   No murmur heard. Pulmonary/Chest: Effort normal and breath sounds normal.  Abdominal: Soft. Bowel sounds are normal. She exhibits no distension and no mass. There is tenderness in the epigastric area. There is no rebound and no guarding.  Lymphadenopathy:    She has no cervical adenopathy.  Neurological: She is alert and oriented to person, place, and time.  Skin: Skin is warm and dry. No rash noted. She is not diaphoretic.  Psychiatric: She has a normal mood and affect. Her behavior is normal. Judgment and thought content normal.       Assessment & Plan:  Abdominal pain, epigastric - Plan: omeprazole (PRILOSEC) 20 MG capsule, ranitidine (ZANTAC) 150 MG tablet  Gastritis  Insomnia - Plan: hydrOXYzine (ATARAX/VISTARIL) 25 MG tablet  1.  Abdominal pain epigastric:  New.  Onset in past week; discussed ddx in detail with patient to include gastritis, PUD, GERD.  Pt declined labs.  Rx for Prilosec 20mg  daily provided to take for the next 2-4 weeks.  Rx for Zantac 150mg  qhs to take for the next 2-4 weeks as well. Dietary modification reviewed in detail; to limit/avoid caffeine and spicy foods. Advised to also avoid NSAIDs.  If no improvement in upcoming 3-4 weeks, will need to RTC. 2.  Gastritis:  New.  Likely secondary to excessive NSAID use.  Stop NSAIDs.  Rx for Prilosec and Zantac provided. 3. Insomnia: uncontrolled; rx  for Hydroxozine provided.  Follow-up with Merla Riches.  Meds ordered this encounter  Medications  . norethindrone-ethinyl estradiol (JUNEL FE,GILDESS FE,LOESTRIN FE) 1-20 MG-MCG tablet    Sig: Take 1 tablet by mouth daily.  Marland Kitchen omeprazole (PRILOSEC) 20 MG capsule    Sig: Take 1 capsule (20 mg total) by mouth daily.    Dispense:  30 capsule    Refill:  3   . ranitidine (ZANTAC) 150 MG tablet    Sig: Take 1 tablet (150 mg total) by mouth at bedtime.    Dispense:  30 tablet    Refill:  0  . hydrOXYzine (ATARAX/VISTARIL) 25 MG tablet    Sig: Take 1 tablet (25 mg total) by mouth at bedtime as needed.    Dispense:  30 tablet    Refill:  3

## 2012-12-03 ENCOUNTER — Ambulatory Visit (INDEPENDENT_AMBULATORY_CARE_PROVIDER_SITE_OTHER): Payer: 59 | Admitting: Internal Medicine

## 2012-12-03 VITALS — BP 102/66 | HR 60 | Temp 98.9°F | Resp 16 | Ht 67.5 in | Wt 147.6 lb

## 2012-12-03 DIAGNOSIS — F429 Obsessive-compulsive disorder, unspecified: Secondary | ICD-10-CM

## 2012-12-03 DIAGNOSIS — G4700 Insomnia, unspecified: Secondary | ICD-10-CM

## 2012-12-03 DIAGNOSIS — R1013 Epigastric pain: Secondary | ICD-10-CM

## 2012-12-03 DIAGNOSIS — K921 Melena: Secondary | ICD-10-CM

## 2012-12-03 DIAGNOSIS — I890 Lymphedema, not elsewhere classified: Secondary | ICD-10-CM

## 2012-12-03 DIAGNOSIS — F419 Anxiety disorder, unspecified: Secondary | ICD-10-CM

## 2012-12-03 LAB — POCT CBC
HCT, POC: 42.2 % (ref 37.7–47.9)
Lymph, poc: 2.2 (ref 0.6–3.4)
MCHC: 32.7 g/dL (ref 31.8–35.4)
MID (cbc): 0.3 (ref 0–0.9)
POC Granulocyte: 2.9 (ref 2–6.9)
POC LYMPH PERCENT: 40.4 %L (ref 10–50)
POC MID %: 5.7 %M (ref 0–12)
Platelet Count, POC: 326 10*3/uL (ref 142–424)
RDW, POC: 12.3 %

## 2012-12-03 LAB — COMPREHENSIVE METABOLIC PANEL
Albumin: 4.7 g/dL (ref 3.5–5.2)
Alkaline Phosphatase: 39 U/L (ref 39–117)
BUN: 16 mg/dL (ref 6–23)
Creat: 0.75 mg/dL (ref 0.50–1.10)
Glucose, Bld: 93 mg/dL (ref 70–99)
Total Bilirubin: 0.4 mg/dL (ref 0.3–1.2)

## 2012-12-03 LAB — IFOBT (OCCULT BLOOD): IFOBT: NEGATIVE

## 2012-12-03 MED ORDER — ALBENDAZOLE 200 MG PO TABS: 400.0000 mg | ORAL_TABLET | Freq: Once | ORAL | Status: DC

## 2012-12-03 MED ORDER — FLUOXETINE HCL 40 MG PO CAPS: 80.0000 mg | ORAL_CAPSULE | Freq: Every day | ORAL | Status: DC

## 2012-12-03 NOTE — Progress Notes (Signed)
Subjective:    Patient ID: Mary Flowers, female    DOB: [birth year], 27 y.o.   MRN: [id number]  HPI  Patient states she has had episodes recently with abdominal pain. States she had 1 episode of bright red blood. States also she has seen a worm in her stool that looks like a pencil size segment. She indicates she has dogs which may have exposed her to worms. Denies any itching of anal area. Denies any changes to appetite. Denies constipation. No pain with bowel movements, no history of hemorrhoids.  See last OV. Has had 2 weeks of abdominal pain/ discomfort epigastric area with some improvement with Zantac and Omeprazole.    Patient states she is well with her OCD, taking medications as directed. Sleep OK. Anx controlled.  Patient working full time, as a Emergency planning/management officer,  and in school for undergraduate, majoring in psychology, looking for graduate program currently. ?[employer] and couns psych    Review of Systems No fever chills or night sweats No weight loss Nausea and vomiting No genitourinary symptoms No changes in menses Lymphedema is still a problem /  uses compression stockings  Objective:   Physical Exam BP 102/66  Pulse 60  Temp(Src) 98.9 F (37.2 C) (Oral)  Resp 16  Ht 5' 7.5" (1.715 m)  Wt 147 lb 9.6 oz (66.951 kg)  BMI 22.76 kg/m2  SpO2 99%  LMP 11/21/2012 No acute distress  No thyromegaly or adenopathy Heart regular without murmur Lungs clear Abdomen soft. Some mild tenderness on exam, left lower abdomen. No rebound or guarding. Lymphedema left lower extremity noted, patient using knee high compression hose.( She is to be fitted soon for thigh high support, which would be helpful.) Rectal area clear without fissure or hemorrhoid. Digital exam without masses. Minimal stool heme-negative.     Results for orders placed in visit on 12/03/12  POCT CBC      Result Value Range   WBC 5.4  4.6 - 10.2 K/uL   Lymph, poc 2.2  0.6 - 3.4   POC LYMPH PERCENT 40.4  10 -  50 %L   MID (cbc) 0.3  0 - 0.9   POC MID % 5.7  0 - 12 %M   POC Granulocyte 2.9  2 - 6.9   Granulocyte percent 53.9  37 - 80 %G   RBC 4.35  4.04 - 5.48 M/uL   Hemoglobin 13.8  12.2 - 16.2 g/dL   HCT, POC 40.9  81.1 - 47.9 %   MCV 96.9  80 - 97 fL   MCH, POC 31.7 (*) 27 - 31.2 pg   MCHC 32.7  31.8 - 35.4 g/dL   RDW, POC 91.4     Platelet Count, POC 326  142 - 424 K/uL   MPV 8.9  0 - 99.8 fL  IFOBT (OCCULT BLOOD)      Result Value Range   IFOBT Negative      Assessment & Plan:  Hematochezia -check sedimentation rate Dyspepsia -check CMET/HPylori IgM Lymphedema - OCD (obsessive compulsive disorder) -continue Prozac Anxiety Insomnia ? Worms-empiric albendazole 400 Meds ordered this encounter  Medications  . albendazole (ALBENZA) 200 MG tablet    Sig: Take 2 tablets (400 mg total) by mouth once.    Dispense:  2 tablet    Refill:  0  . FLUoxetine (PROZAC) 40 MG capsule    Sig: Take 2 capsules (80 mg total) by mouth daily.    Dispense:  180 capsule    Refill:  1   followup if bleeding continues

## 2013-01-01 ENCOUNTER — Other Ambulatory Visit: Payer: Self-pay | Admitting: Family Medicine

## 2013-01-23 ENCOUNTER — Telehealth: Payer: Self-pay | Admitting: *Deleted

## 2013-01-23 NOTE — Telephone Encounter (Signed)
Pt received orthotic notice but does not have the amount that has been currently given about $300.00, but was quoted about $150.00 or so.  Would like to speak to someone concerning payment policy.

## 2013-01-23 NOTE — Telephone Encounter (Signed)
[facility] [phone] states pt will benefit from Class III thigh high compression hose for lymphedema.  Dr Irving Shows ordered the recommended compression hose and I faxed to [facility] attn: Robin W.

## 2013-01-25 NOTE — Telephone Encounter (Signed)
[facility] SENDING TO THE BILLING DEPT

## 2013-01-25 NOTE — Telephone Encounter (Signed)
[facility] SENDING MESSAGE TO BILLING DEPT

## 2013-02-17 ENCOUNTER — Encounter (HOSPITAL_BASED_OUTPATIENT_CLINIC_OR_DEPARTMENT_OTHER): Payer: Self-pay | Admitting: Emergency Medicine

## 2013-02-17 ENCOUNTER — Emergency Department (HOSPITAL_BASED_OUTPATIENT_CLINIC_OR_DEPARTMENT_OTHER): Payer: Worker's Compensation

## 2013-02-17 ENCOUNTER — Emergency Department (HOSPITAL_BASED_OUTPATIENT_CLINIC_OR_DEPARTMENT_OTHER)
Admission: EM | Admit: 2013-02-17 | Discharge: 2013-02-17 | Disposition: A | Discharge status: Home / Self Care | Payer: Worker's Compensation | Attending: Emergency Medicine | Admitting: Emergency Medicine

## 2013-02-17 DIAGNOSIS — S6990XA Unspecified injury of unspecified wrist, hand and finger(s), initial encounter: Secondary | ICD-10-CM | POA: Insufficient documentation

## 2013-02-17 DIAGNOSIS — F411 Generalized anxiety disorder: Secondary | ICD-10-CM | POA: Insufficient documentation

## 2013-02-17 DIAGNOSIS — Z79899 Other long term (current) drug therapy: Secondary | ICD-10-CM | POA: Insufficient documentation

## 2013-02-17 DIAGNOSIS — S6991XA Unspecified injury of right wrist, hand and finger(s), initial encounter: Secondary | ICD-10-CM

## 2013-02-17 DIAGNOSIS — W230XXA Caught, crushed, jammed, or pinched between moving objects, initial encounter: Secondary | ICD-10-CM | POA: Insufficient documentation

## 2013-02-17 DIAGNOSIS — S6980XA Other specified injuries of unspecified wrist, hand and finger(s), initial encounter: Secondary | ICD-10-CM | POA: Insufficient documentation

## 2013-02-17 DIAGNOSIS — Y9210 Unspecified residential institution as the place of occurrence of the external cause: Secondary | ICD-10-CM | POA: Insufficient documentation

## 2013-02-17 DIAGNOSIS — F17200 Nicotine dependence, unspecified, uncomplicated: Secondary | ICD-10-CM | POA: Insufficient documentation

## 2013-02-17 DIAGNOSIS — Y9389 Activity, other specified: Secondary | ICD-10-CM | POA: Insufficient documentation

## 2013-02-17 DIAGNOSIS — Z8679 Personal history of other diseases of the circulatory system: Secondary | ICD-10-CM | POA: Insufficient documentation

## 2013-02-17 DIAGNOSIS — F429 Obsessive-compulsive disorder, unspecified: Secondary | ICD-10-CM | POA: Insufficient documentation

## 2013-02-17 HISTORY — DX: Lymphedema, not elsewhere classified: I89.0
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
## 2013-04-16 ENCOUNTER — Ambulatory Visit (INDEPENDENT_AMBULATORY_CARE_PROVIDER_SITE_OTHER): Payer: 59 | Admitting: Family Medicine

## 2013-04-16 VITALS — BP 118/68 | HR 82 | Temp 98.8°F | Resp 17 | Ht 68.0 in | Wt 151.0 lb

## 2013-04-16 DIAGNOSIS — K3189 Other diseases of stomach and duodenum: Secondary | ICD-10-CM

## 2013-04-16 DIAGNOSIS — F429 Obsessive-compulsive disorder, unspecified: Secondary | ICD-10-CM

## 2013-04-16 DIAGNOSIS — R1013 Epigastric pain: Secondary | ICD-10-CM

## 2013-04-16 DIAGNOSIS — Z3200 Encounter for pregnancy test, result unknown: Secondary | ICD-10-CM

## 2013-04-16 LAB — POCT URINE PREGNANCY: Preg Test, Ur: NEGATIVE

## 2013-04-16 MED ORDER — PANTOPRAZOLE SODIUM 40 MG PO TBEC: 40.0000 mg | DELAYED_RELEASE_TABLET | Freq: Every day | ORAL | Status: DC

## 2013-04-16 MED ORDER — FLUOXETINE HCL 20 MG PO CAPS: ORAL_CAPSULE | ORAL | Status: DC

## 2013-04-16 NOTE — Patient Instructions (Addendum)
Let Dr. Merla Richesoolittle know if you become pregnant she can be taken off your  Prozac (fluoxazine)   Use the 20 mg Prozac (fluoxazine)  one daily (20 mg) alternate with 2 daily (40 mg) for 2 or 3 weeks, and if doing well decrease to one daily (20 mg). If continued to do well on the 20 mg for several weeks, speak with Dr. Merla Richesoolittle about giving you some 10 mg pills so you can continue your taper slowly.  Protonix 20 mg one daily for stomach  Referral is being made to a gastroenterologist  Pregnancy test on blood is pending  See Dr. Merla Richesoolittle back in one month

## 2013-04-16 NOTE — Progress Notes (Signed)
Subjective: 28 year old lady who is generally a patient of Dr. Netta Corriganoolittle's. [place]. She is here for several issues:  First, she has been on medication for OCD for a long time. After last visit she cut her self down from 80 mg a day to 40 mg a day. She has a desire to become pregnant. It has been several months since she made that change, and she has adapted satisfactorily. She would like to 20 mg pills to allow her to continue a taper of this.  Secondly, she had been trying to get pregnant. She is not quite a month since her last period, however has tried a home pregnancy test because she also could be pregnant. She got a negative result and would like us to do a serum pregnancy test. I told her we can do that, but that says she hasn't missed a period yet it would probably negative. However since she is trying to get off of her SSRI I will go ahead and do so if needed. First do a urine pregnancy.   Third, she has a long history of intermittent dyspepsia. She thinks she might have an ulcer or something. Dr. Merla Richesoolittle did testing on her about this last visit, and H. pylori was negative. She has tried both omeprazole and ranitidine with some relief. However recently the discomfort is come back, worse the last couple of days, with pain in the hypogastrium. She lost about 30 pounds early last year, and has stabilized since last summer. That was a desired weight loss. She has a bulky dressing on her left leg which she wears for chronic lymphedema. I'm sure that makes him inadequacy of her office weights.  Objective: Healthy-appearing young lady. She apparently had a recent infection of her earlobes from piercing, and took some amoxicillin. The ears look fine. Her TMs are normal. Throat clear. Neck supple without nodes or thyromegaly. Chest clear to auscultation. Heart regular without murmurs. Abdomen soft without masses or tenderness.  Assessment: OCD, tapering medication for  desire pregnancy Attempting pregnancy Status post ear lobe infection Epigastric pain and dyspepsia  Plan: Urine hCG. If negative do serum hCG Refer to gastroenterology Antibiotics 20 mg one daily 20 mg fluoxetine pills so she can taper to Results for orders placed in visit on 04/16/13  POCT URINE PREGNANCY      Result Value Range   Preg Test, Ur Negative

## 2013-04-17 LAB — HCG, SERUM, QUALITATIVE: PREG SERUM: NEGATIVE

## 2013-05-03 ENCOUNTER — Other Ambulatory Visit: Payer: Self-pay | Admitting: Gastroenterology

## 2013-05-03 DIAGNOSIS — R1013 Epigastric pain: Secondary | ICD-10-CM

## 2013-05-16 ENCOUNTER — Ambulatory Visit (HOSPITAL_COMMUNITY): Payer: Self-pay

## 2013-05-20 ENCOUNTER — Encounter (HOSPITAL_COMMUNITY)
Admission: RE | Admit: 2013-05-20 | Discharge: 2013-05-20 | Disposition: A | Discharge status: Home / Self Care | Payer: 59 | Source: Ambulatory Visit | Attending: Gastroenterology | Admitting: Gastroenterology

## 2013-05-20 ENCOUNTER — Ambulatory Visit (HOSPITAL_COMMUNITY)
Admission: RE | Admit: 2013-05-20 | Discharge: 2013-05-20 | Disposition: A | Discharge status: Home / Self Care | Payer: 59 | Source: Ambulatory Visit | Attending: Gastroenterology | Admitting: Gastroenterology

## 2013-05-20 DIAGNOSIS — R1013 Epigastric pain: Secondary | ICD-10-CM

## 2013-05-20 DIAGNOSIS — R109 Unspecified abdominal pain: Secondary | ICD-10-CM | POA: Insufficient documentation

## 2013-05-20 DIAGNOSIS — R112 Nausea with vomiting, unspecified: Secondary | ICD-10-CM | POA: Insufficient documentation

## 2013-05-20 MED ORDER — SINCALIDE 5 MCG IJ SOLR
INTRAMUSCULAR | Status: AC
  Administered 2013-05-20: 10:00:00 5 ug via INTRAVENOUS
  Filled 2013-05-20: qty 5

## 2013-05-20 MED ORDER — TECHNETIUM TC 99M MEBROFENIN IV KIT
5.0000 | PACK | Freq: Once | INTRAVENOUS | Status: AC | PRN
  Administered 2013-05-20: 5 via INTRAVENOUS

## 2013-05-20 MED ORDER — SINCALIDE 5 MCG IJ SOLR
0.0200 ug/kg | Freq: Once | INTRAMUSCULAR | Status: AC
  Administered 2013-05-20: 5 ug via INTRAVENOUS

## 2013-05-24 ENCOUNTER — Other Ambulatory Visit: Payer: Self-pay | Admitting: Gastroenterology

## 2013-05-24 ENCOUNTER — Ambulatory Visit
Admission: RE | Admit: 2013-05-24 | Discharge: 2013-05-24 | Disposition: A | Discharge status: Home / Self Care | Payer: 59 | Source: Ambulatory Visit | Attending: Gastroenterology | Admitting: Gastroenterology

## 2013-05-24 DIAGNOSIS — R1013 Epigastric pain: Secondary | ICD-10-CM

## 2013-05-24 DIAGNOSIS — K625 Hemorrhage of anus and rectum: Secondary | ICD-10-CM

## 2013-05-24 MED ORDER — IOHEXOL 300 MG/ML  SOLN
100.0000 mL | Freq: Once | INTRAMUSCULAR | Status: AC | PRN
  Administered 2013-05-24: 100 mL via INTRAVENOUS

## 2013-05-24 MED ORDER — IOHEXOL 300 MG/ML  SOLN
30.0000 mL | Freq: Once | INTRAMUSCULAR | Status: AC | PRN
  Administered 2013-05-24: 30 mL via ORAL

## 2013-05-29 DIAGNOSIS — M722 Plantar fascial fibromatosis: Secondary | ICD-10-CM

## 2013-06-03 ENCOUNTER — Ambulatory Visit (INDEPENDENT_AMBULATORY_CARE_PROVIDER_SITE_OTHER): Payer: 59 | Admitting: General Surgery

## 2013-06-03 ENCOUNTER — Encounter (INDEPENDENT_AMBULATORY_CARE_PROVIDER_SITE_OTHER): Payer: Self-pay | Admitting: General Surgery

## 2013-06-03 VITALS — BP 102/78 | HR 86 | Temp 98.1°F | Resp 14

## 2013-06-03 DIAGNOSIS — K811 Chronic cholecystitis: Secondary | ICD-10-CM

## 2013-06-03 DIAGNOSIS — K828 Other specified diseases of gallbladder: Secondary | ICD-10-CM | POA: Insufficient documentation

## 2013-06-03 NOTE — Patient Instructions (Signed)

## 2013-06-03 NOTE — Assessment & Plan Note (Signed)
Patient does appear to have biliary source of pain given the fact that she has postprandial attacks of epigastric pain, worsening with fatty foods, and pain with CCK injection.  She does not have significant tenderness in her right upper quadrant, but given the other factors, I think that she would benefit from a cholecystectomy.  I reviewed this with the patient. I discussed that when all of her imaging studies appear normal, there is a chance that she would not improve with cholecystectomy.  The surgical procedure was described to the patient in detail.  The patient was given Agricultural engineereducational material. .  I discussed the incision type and location, the location of the gallbladder, the anatomy of the bile ducts and arteries, and the typical progression of surgery.  I discussed the possibility of converting to an open operation.  I advised of the risks of bleeding, infection, damage to other structures (such as the bile duct, intestine or liver), bile leak, need for other procedures or surgeries, and post op diarrhea/constipation.  We discussed the risk of blood clot.  We discussed the recovery period and post operative restrictions.  The patient was advised against taking blood thinners the week before surgery.

## 2013-06-03 NOTE — Progress Notes (Signed)
Chief Complaint  Patient presents with  . Biliary Dyskinesia    HISTORY: Patient is a 28 year old female has had approximately 5 months of epigastric abdominal pain. She she had an ulcer. She went to see GI. She took proton pump inhibitors for approximately 2-4 weeks. She did not notice significant improvement. She has some chronic epigastric discomfort, but primarily has episodic attacks of severe pain, nausea, and vomiting. These are mostly after she ears, around 30-45 minutes. She also noticed worsening with fatty foods. Since she has been eating mostly smoothies and low-fat food, she has had slightly fewer attacks.  She also states that she intermittently has a light-colored stool intermittently has fevers. She has some gassy sensation with occasional loose stools and diarrhea. She denies family history of gallstones. She has undergone ultrasound, which was negative. She's also had a CT scan which showed some slightly prominent mesenteric lymph nodes, and has had a normal EGD. Her HIDA scan showed a normal ejection fraction, but she did have replication of her symptoms with injection of CCK.  Past Medical History  Diagnosis Date  . OCD (obsessive compulsive disorder)   . Anxiety disorder   . PMDD (premenstrual dysphoric disorder)   . Anxiety   . Lymph edema LLE  . Biliary dyskinesia     History reviewed. No pertinent past surgical history.  Current Outpatient Prescriptions  Medication Sig Dispense Refill  . ibuprofen (ADVIL,MOTRIN) 200 MG tablet Take 200 mg by mouth every 6 (six) hours as needed.      . Prenatal Multivit-Min-Fe-FA (PRENATAL VITAMINS PO) Take by mouth daily.      . promethazine (PHENERGAN) 12.5 MG suppository Place 12.5 mg rectally every 6 (six) hours as needed for nausea or vomiting.       No current facility-administered medications for this visit.     No Known Allergies   Family History  Problem Relation Age of Onset  . Cancer Maternal Grandmother   .  Diabetes Maternal Grandfather   . Heart disease Paternal Grandfather   . Stroke Paternal Grandfather      History   Social History  . Marital Status: Married    Spouse Name: N/A    Number of Children: N/A  . Years of Education: N/A   Social History Main Topics  . Smoking status: Current Some Day Smoker    Types: Cigarettes  . Smokeless tobacco: Never Used  . Alcohol Use: 0.6 oz/week    1 Glasses of wine per week     Comment: daily wine use 1 glass  . Drug Use: No  . Sexual Activity: None   Other Topics Concern  . None- [place] from [place]   Social History Narrative  . Emergency planning/management officerolice officer, trying to get pregnant.       REVIEW OF SYSTEMS - PERTINENT POSITIVES ONLY: 12 point review of systems negative other than HPI and PMH except for intermittent fevers, left leg lymphedema, headaches  EXAM: Filed Vitals:   06/03/13 1030  BP: 102/78  Pulse: 86  Temp: 98.1 F (36.7 C)  Resp: 14    Wt Readings from Last 3 Encounters:  04/16/13 151 lb (68.493 kg)  02/17/13 145 lb (65.772 kg)  12/03/12 147 lb 9.6 oz (66.951 kg)     Gen:  No acute distress.  Well nourished and well groomed.   Neurological: Alert and oriented to person, place, and time. Coordination normal.  Head: Normocephalic and atraumatic.  Eyes: Conjunctivae are normal. Pupils are equal, round, and reactive to light.  No scleral icterus.  Neck: Normal range of motion. Neck supple. No tracheal deviation or thyromegaly present.  Cardiovascular: Normal rate, regular rhythm, normal heart sounds and intact distal pulses.  Exam reveals no gallop and no friction rub.  No murmur heard. Respiratory: Effort normal.  No respiratory distress. No chest wall tenderness. Breath sounds normal.  No wheezes, rales or rhonchi.  GI: Soft. Bowel sounds are normal. The abdomen is soft and nontender.  There is no rebound and no guarding.  Musculoskeletal: Normal range of motion. Extremities are nontender.  Lymphadenopathy: No  cervical, preauricular, postauricular or axillary adenopathy is present Skin: Skin is warm and dry. No rash noted. No diaphoresis. No erythema. No pallor. No clubbing, cyanosis, or edema.   Psychiatric: Normal mood and affect. Behavior is normal. Judgment and thought content normal.    LABORATORY RESULTS: Available labs are reviewed   Recent Results (from the past 2160 hour(s))  POCT URINE PREGNANCY     Status: None   Collection Time    04/16/13  4:42 PM      Result Value Ref Range   Preg Test, Ur Negative    HCG, SERUM, QUALITATIVE     Status: None   Collection Time    04/16/13  4:48 PM      Result Value Ref Range   Preg, Serum NEG       RADIOLOGY RESULTS: See E-Chart or I-Site for most recent results.  Images and reports are reviewed.  US Abdomen Complete  05/20/2013   CLINICAL DATA:  Epigastric pain  EXAM: ULTRASOUND ABDOMEN COMPLETE  COMPARISON:  CT abdomen and pelvis April 05, 2009  FINDINGS: Gallbladder:  No gallstones or wall thickening visualized. There is no pericholecystic fluid. No sonographic Murphy sign noted.  Common bile duct:  Diameter: 5 mm. There is no intrahepatic, common hepatic, or common bile duct dilatation.  Liver:  No focal lesion identified. Within normal limits in parenchymal echogenicity.  IVC:  No abnormality visualized.  Pancreas:  No mass or inflammatory focus.  Spleen:  Size and appearance within normal limits.  Right Kidney:  Length: 11.1 cm. Echogenicity within normal limits. No mass or hydronephrosis visualized.  Left Kidney:  Length: 11.4 cm. Echogenicity within normal limits. No mass or hydronephrosis visualized.  Abdominal aorta:  No aneurysm visualized.  Other findings:  There is no demonstrable ascites.  IMPRESSION: Study within normal limits.   Electronically Signed   By: Bretta Bang M.D.   On: 05/20/2013 08:49   Ct Abdomen Pelvis W Contrast  05/24/2013   CLINICAL DATA:  Epigastric pain, fever and rectal bleeding  EXAM: CT ABDOMEN AND  PELVIS WITH CONTRAST  TECHNIQUE: Multidetector CT imaging of the abdomen and pelvis was performed using the standard protocol following bolus administration of intravenous contrast.  CONTRAST:  100 cc of Omni 300  COMPARISON:  05/06/2009  FINDINGS: The lung bases are clear.  No pleural or pericardial effusion.  No focal liver abnormality identified. The gallbladder appears normal. No biliary dilatation. Normal appearance of the pancreas. The spleen appears normal.  The adrenal glands are both normal. The kidneys are both on unremarkable. The urinary bladder appears normal. The uterus and adnexal structures are on unremarkable.  Normal caliber of the abdominal aorta. There is no aneurysm. Multiple small jejunal and ileocolic mesenteric lymph nodes are identified. There is no adenopathy. No pelvic or inguinal adenopathy identified. A small amount of free fluid is identified within the cul-de-sac. No focal fluid collections identified.  The stomach  is normal. The small bowel loops have a normal course and caliber without evidence for bowel obstruction. Normal appearance of the colon. The appendix is visualized and appears normal.  Review of the visualized osseous structures is on unremarkable. There are no aggressive lytic or sclerotic bone lesions identified.  IMPRESSION: 1. Multiple small (sub cm) mesenteric lymph nodes are identified without adenopathy. Correlate for any clinical signs or symptoms of mesenteric adenitis. 2. Small amount of free fluid is noted within the dependent portion of the pelvis. 3. No evidence for bowel obstruction, bowel perforation or abscess formation. 4. The appendix is visualized and appears normal.   Electronically Signed   By: Signa Kell M.D.   On: 05/24/2013 16:50   Nm Hepato W/eject Fract  05/20/2013   CLINICAL DATA:  Abdominal pain with nausea and vomiting  EXAM: NUCLEAR MEDICINE HEPATOBILIARY IMAGING WITH GALLBLADDER EF  Views:  Anterior, right lateral right upper quadrant   Radionuclide: Technetium 52m Choletec  Dose:  5.0 mCi  Route of administration: Intravenous  COMPARISON:  Abdominal ultrasound May 20, 2013  FINDINGS: Liver uptake of radiotracer is normal. There is prompt visualization of gallbladder and small bowel, indicating patency of the cystic and common bile ducts.  A weight based dose, 1.36 mcg, of CCK was administered intravenously with calculation of the computer generated ejection fraction of radiotracer from the gallbladder. The patient did experience pain with intravenous CCK administration. The ejection fraction of radiotracer from the gallbladder is measured at 48.3%, normal greater than 38%.  IMPRESSION: Normal ejection fraction of radiotracer from the gallbladder. Cystic and common bile ducts are patent. Patient did experience pain with intravenous CCK administration.   Electronically Signed   By: Bretta Bang M.D.   On: 05/20/2013 11:02      ASSESSMENT AND PLAN: Biliary dyskinesia Patient does appear to have biliary source of pain given the fact that she has postprandial attacks of epigastric pain, worsening with fatty foods, and pain with CCK injection.  She does not have significant tenderness in her right upper quadrant, but given the other factors, I think that she would benefit from a cholecystectomy.  I reviewed this with the patient. I discussed that when all of her imaging studies appear normal, there is a chance that she would not improve with cholecystectomy.  The surgical procedure was described to the patient in detail.  The patient was given Agricultural engineer. .  I discussed the incision type and location, the location of the gallbladder, the anatomy of the bile ducts and arteries, and the typical progression of surgery.  I discussed the possibility of converting to an open operation.  I advised of the risks of bleeding, infection, damage to other structures (such as the bile duct, intestine or liver), bile leak, need for  other procedures or surgeries, and post op diarrhea/constipation.  We discussed the risk of blood clot.  We discussed the recovery period and post operative restrictions.  The patient was advised against taking blood thinners the week before surgery.           Maudry Diego MD Surgical Oncology, General and Endocrine Surgery [facility]      Visit Diagnoses: 1. Chronic cholecystitis     Primary Care Physician: Tonye Pearson, MD

## 2013-06-10 ENCOUNTER — Encounter (HOSPITAL_COMMUNITY)
Admission: RE | Admit: 2013-06-10 | Discharge: 2013-06-10 | Disposition: A | Discharge status: Home / Self Care | Payer: 59 | Source: Ambulatory Visit | Attending: General Surgery | Admitting: General Surgery

## 2013-06-10 ENCOUNTER — Encounter (HOSPITAL_COMMUNITY): Payer: Self-pay

## 2013-06-10 HISTORY — DX: Gastro-esophageal reflux disease without esophagitis: K21.9

## 2013-06-10 HISTORY — DX: Other specified postprocedural states: Z98.890

## 2013-06-10 HISTORY — DX: Nausea with vomiting, unspecified: R11.2

## 2013-06-10 HISTORY — DX: Adverse effect of unspecified anesthetic, initial encounter: T41.45XA

## 2013-06-10 HISTORY — DX: Other complications of anesthesia, initial encounter: T88.59XA

## 2013-06-10 LAB — CBC WITH DIFFERENTIAL/PLATELET
Basophils Absolute: 0.1 10*3/uL (ref 0.0–0.1)
Basophils Relative: 1 % (ref 0–1)
Eosinophils Absolute: 0.4 10*3/uL (ref 0.0–0.7)
Eosinophils Relative: 6 % — ABNORMAL HIGH (ref 0–5)
HCT: 38.4 % (ref 36.0–46.0)
Hemoglobin: 13.4 g/dL (ref 12.0–15.0)
LYMPHS PCT: 43 % (ref 12–46)
Lymphs Abs: 2.3 10*3/uL (ref 0.7–4.0)
MCH: 31.1 pg (ref 26.0–34.0)
MCHC: 34.9 g/dL (ref 30.0–36.0)
MCV: 89.1 fL (ref 78.0–100.0)
Monocytes Absolute: 0.4 10*3/uL (ref 0.1–1.0)
Monocytes Relative: 7 % (ref 3–12)
Neutro Abs: 2.4 10*3/uL (ref 1.7–7.7)
Neutrophils Relative %: 43 % (ref 43–77)
PLATELETS: 268 10*3/uL (ref 150–400)
RBC: 4.31 MIL/uL (ref 3.87–5.11)
RDW: 11.7 % (ref 11.5–15.5)
WBC: 5.4 10*3/uL (ref 4.0–10.5)

## 2013-06-10 LAB — COMPREHENSIVE METABOLIC PANEL
ALT: 23 U/L (ref 0–35)
AST: 22 U/L (ref 0–37)
Albumin: 4 g/dL (ref 3.5–5.2)
Alkaline Phosphatase: 59 U/L (ref 39–117)
BUN: 16 mg/dL (ref 6–23)
CHLORIDE: 105 meq/L (ref 96–112)
CO2: 24 meq/L (ref 19–32)
Calcium: 9.6 mg/dL (ref 8.4–10.5)
Creatinine, Ser: 0.75 mg/dL (ref 0.50–1.10)
GFR calc Af Amer: >90 mL/min (ref >90)
Glucose, Bld: 80 mg/dL (ref 70–99)
POTASSIUM: 4.5 meq/L (ref 3.7–5.3)
Sodium: 142 mEq/L (ref 137–147)
Total Bilirubin: 0.3 mg/dL (ref 0.3–1.2)
Total Protein: 6.7 g/dL (ref 6.0–8.3)

## 2013-06-10 LAB — PROTIME-INR
INR: 1.06 (ref 0.00–1.49)
Prothrombin Time: 13.6 seconds (ref 11.6–15.2)

## 2013-06-10 LAB — APTT: APTT: 28 s (ref 24–37)

## 2013-06-10 LAB — HCG, SERUM, QUALITATIVE: Preg, Serum: NEGATIVE

## 2013-06-10 NOTE — Pre-Procedure Instructions (Addendum)
Mary Flowers  06/10/2013   Your procedure is scheduled on:  Thursday, March 12.  [facility], Main Entrance or Entrance "A" at 11:00 AM.  Call this number if you have problems the morning of surgery: [phone]   Remember:   Do not eat food or drink liquids after midnight Wednesday.    Take these medicines the morning of surgery with A SIP OF WATER: None             Take if needed:promethazine (PHENERGAN)  Stop taking Aspirin, Coumadin, Plavix, Effient and Herbal medications.  Do not take any NSAIDs ie: Ibuprofen,  Advil,Naproxen or any medication containing Aspirin.   Do not wear jewelry, make-up or nail polish.  Do not wear lotions, powders, or perfumes.   Do not shave 48 hours prior to surgery.   Do not bring valuables to the hospital.  [facility] is not responsible   for any belongings or valuables.               Contacts, dentures or bridgework may not be worn into surgery.  Leave suitcase in the car. After surgery it may be brought to your room.  For patients admitted to the hospital, discharge time is determined by your treatment team.               Patients discharged the day of surgery will not be allowed to drive home.  Name and phone number of your driver: -  Special Instructions: Review  [place] - Preparing For Surgery.   Please read over the following fact sheets that you were given: Pain Booklet, Coughing and Deep Breathing and Surgical Site Infection Prevention

## 2013-06-12 MED ORDER — CEFAZOLIN SODIUM-DEXTROSE 2-3 GM-% IV SOLR
2.0000 g | INTRAVENOUS | Status: AC
  Administered 2013-06-13: 2 g via INTRAVENOUS
  Filled 2013-06-12: qty 50

## 2013-06-13 ENCOUNTER — Encounter (HOSPITAL_COMMUNITY): Payer: Self-pay | Admitting: Surgery

## 2013-06-13 ENCOUNTER — Encounter (HOSPITAL_COMMUNITY): Payer: 59 | Admitting: Anesthesiology

## 2013-06-13 ENCOUNTER — Ambulatory Visit (HOSPITAL_COMMUNITY)
Admission: RE | Admit: 2013-06-13 | Discharge: 2013-06-13 | Disposition: A | Discharge status: Home / Self Care | Payer: 59 | Source: Ambulatory Visit | Attending: General Surgery | Admitting: General Surgery

## 2013-06-13 ENCOUNTER — Encounter (HOSPITAL_COMMUNITY)
Admission: RE | Disposition: A | Discharge status: Home / Self Care | Payer: Self-pay | Source: Ambulatory Visit | Attending: General Surgery

## 2013-06-13 ENCOUNTER — Ambulatory Visit (HOSPITAL_COMMUNITY): Payer: 59 | Admitting: Anesthesiology

## 2013-06-13 DIAGNOSIS — F329 Major depressive disorder, single episode, unspecified: Secondary | ICD-10-CM | POA: Insufficient documentation

## 2013-06-13 DIAGNOSIS — F3289 Other specified depressive episodes: Secondary | ICD-10-CM | POA: Insufficient documentation

## 2013-06-13 DIAGNOSIS — F429 Obsessive-compulsive disorder, unspecified: Secondary | ICD-10-CM | POA: Insufficient documentation

## 2013-06-13 DIAGNOSIS — F17200 Nicotine dependence, unspecified, uncomplicated: Secondary | ICD-10-CM | POA: Insufficient documentation

## 2013-06-13 DIAGNOSIS — K219 Gastro-esophageal reflux disease without esophagitis: Secondary | ICD-10-CM | POA: Insufficient documentation

## 2013-06-13 DIAGNOSIS — K811 Chronic cholecystitis: Secondary | ICD-10-CM

## 2013-06-13 DIAGNOSIS — K828 Other specified diseases of gallbladder: Secondary | ICD-10-CM | POA: Insufficient documentation

## 2013-06-13 DIAGNOSIS — F411 Generalized anxiety disorder: Secondary | ICD-10-CM | POA: Insufficient documentation

## 2013-06-13 HISTORY — PX: CHOLECYSTECTOMY: SHX55

## 2013-06-13 SURGERY — LAPAROSCOPIC CHOLECYSTECTOMY
Anesthesia: General | Site: Abdomen

## 2013-06-13 MED ORDER — HYDROMORPHONE HCL PF 1 MG/ML IJ SOLN
INTRAMUSCULAR | Status: AC
  Filled 2013-06-13: qty 1

## 2013-06-13 MED ORDER — FENTANYL CITRATE 0.05 MG/ML IJ SOLN
INTRAMUSCULAR | Status: DC | PRN
  Administered 2013-06-13 (×2): 100 ug via INTRAVENOUS

## 2013-06-13 MED ORDER — PROMETHAZINE HCL 25 MG/ML IJ SOLN
12.5000 mg | Freq: Four times a day (QID) | INTRAMUSCULAR | Status: DC | PRN
  Administered 2013-06-13: 12.5 mg via INTRAVENOUS

## 2013-06-13 MED ORDER — PROPOFOL 10 MG/ML IV BOLUS
INTRAVENOUS | Status: AC
  Filled 2013-06-13: qty 20

## 2013-06-13 MED ORDER — OXYCODONE HCL 5 MG PO TABS: 5.0000 mg | ORAL_TABLET | ORAL | Status: DC | PRN

## 2013-06-13 MED ORDER — MIDAZOLAM HCL 5 MG/5ML IJ SOLN
INTRAMUSCULAR | Status: DC | PRN
  Administered 2013-06-13: 2 mg via INTRAVENOUS

## 2013-06-13 MED ORDER — ACETAMINOPHEN 650 MG RE SUPP: 650.0000 mg | RECTAL | Status: DC | PRN

## 2013-06-13 MED ORDER — PROMETHAZINE HCL 25 MG/ML IJ SOLN
INTRAMUSCULAR | Status: AC
  Filled 2013-06-13: qty 1

## 2013-06-13 MED ORDER — NEOSTIGMINE METHYLSULFATE 1 MG/ML IJ SOLN
INTRAMUSCULAR | Status: AC
  Filled 2013-06-13: qty 10

## 2013-06-13 MED ORDER — OXYCODONE-ACETAMINOPHEN 5-325 MG PO TABS: 1.0000 | ORAL_TABLET | ORAL | Status: DC | PRN

## 2013-06-13 MED ORDER — GLYCOPYRROLATE 0.2 MG/ML IJ SOLN
INTRAMUSCULAR | Status: DC | PRN
  Administered 2013-06-13: 0.6 mg via INTRAVENOUS

## 2013-06-13 MED ORDER — HYDROMORPHONE HCL PF 1 MG/ML IJ SOLN
0.2500 mg | INTRAMUSCULAR | Status: DC | PRN
  Administered 2013-06-13 (×2): 0.5 mg via INTRAVENOUS

## 2013-06-13 MED ORDER — LIDOCAINE HCL (CARDIAC) 20 MG/ML IV SOLN
INTRAVENOUS | Status: DC | PRN
  Administered 2013-06-13: 80 mg via INTRAVENOUS

## 2013-06-13 MED ORDER — NEOSTIGMINE METHYLSULFATE 1 MG/ML IJ SOLN
INTRAMUSCULAR | Status: DC | PRN
  Administered 2013-06-13: 4 mg via INTRAVENOUS

## 2013-06-13 MED ORDER — ARTIFICIAL TEARS OP OINT
TOPICAL_OINTMENT | OPHTHALMIC | Status: DC | PRN
  Administered 2013-06-13: 1 via OPHTHALMIC

## 2013-06-13 MED ORDER — ONDANSETRON HCL 4 MG/2ML IJ SOLN
INTRAMUSCULAR | Status: DC | PRN
  Administered 2013-06-13 (×2): 4 mg via INTRAVENOUS

## 2013-06-13 MED ORDER — MIDAZOLAM HCL 2 MG/2ML IJ SOLN
INTRAMUSCULAR | Status: AC
  Filled 2013-06-13: qty 2

## 2013-06-13 MED ORDER — LACTATED RINGERS IV SOLN
INTRAVENOUS | Status: DC | PRN
  Administered 2013-06-13 (×2): via INTRAVENOUS

## 2013-06-13 MED ORDER — LACTATED RINGERS IV SOLN
INTRAVENOUS | Status: DC
  Administered 2013-06-13: 12:00:00 via INTRAVENOUS

## 2013-06-13 MED ORDER — ACETAMINOPHEN 325 MG PO TABS: 650.0000 mg | ORAL_TABLET | ORAL | Status: DC | PRN

## 2013-06-13 MED ORDER — FENTANYL CITRATE 0.05 MG/ML IJ SOLN
INTRAMUSCULAR | Status: AC
  Filled 2013-06-13: qty 5

## 2013-06-13 MED ORDER — PROPOFOL 10 MG/ML IV BOLUS
INTRAVENOUS | Status: DC | PRN
  Administered 2013-06-13: 150 mg via INTRAVENOUS

## 2013-06-13 MED ORDER — LIDOCAINE HCL 1 % IJ SOLN
INTRAMUSCULAR | Status: DC | PRN
  Administered 2013-06-13: 14:00:00

## 2013-06-13 MED ORDER — ARTIFICIAL TEARS OP OINT
TOPICAL_OINTMENT | OPHTHALMIC | Status: AC
  Filled 2013-06-13: qty 3.5

## 2013-06-13 MED ORDER — CHLORHEXIDINE GLUCONATE 4 % EX LIQD: 1.0000 "application " | Freq: Once | CUTANEOUS | Status: DC

## 2013-06-13 MED ORDER — SODIUM CHLORIDE 0.9 % IR SOLN
Status: DC | PRN
  Administered 2013-06-13: 1

## 2013-06-13 MED ORDER — SODIUM CHLORIDE 0.9 % IV SOLN: 250.0000 mL | INTRAVENOUS | Status: DC | PRN

## 2013-06-13 MED ORDER — ONDANSETRON HCL 4 MG/2ML IJ SOLN: 4.0000 mg | Freq: Four times a day (QID) | INTRAMUSCULAR | Status: DC | PRN

## 2013-06-13 MED ORDER — SODIUM CHLORIDE 0.9 % IJ SOLN: 3.0000 mL | INTRAMUSCULAR | Status: DC | PRN

## 2013-06-13 MED ORDER — GLYCOPYRROLATE 0.2 MG/ML IJ SOLN
INTRAMUSCULAR | Status: AC
  Filled 2013-06-13: qty 2

## 2013-06-13 MED ORDER — DEXAMETHASONE SODIUM PHOSPHATE 4 MG/ML IJ SOLN
INTRAMUSCULAR | Status: DC | PRN
  Administered 2013-06-13: 8 mg via INTRAVENOUS

## 2013-06-13 MED ORDER — ROCURONIUM BROMIDE 100 MG/10ML IV SOLN
INTRAVENOUS | Status: DC | PRN
  Administered 2013-06-13: 40 mg via INTRAVENOUS

## 2013-06-13 MED ORDER — DEXAMETHASONE SODIUM PHOSPHATE 4 MG/ML IJ SOLN
INTRAMUSCULAR | Status: AC
  Filled 2013-06-13: qty 2

## 2013-06-13 MED ORDER — 0.9 % SODIUM CHLORIDE (POUR BTL) OPTIME
TOPICAL | Status: DC | PRN
  Administered 2013-06-13: 1000 mL

## 2013-06-13 MED ORDER — ONDANSETRON HCL 4 MG/2ML IJ SOLN
INTRAMUSCULAR | Status: AC
  Filled 2013-06-13: qty 2

## 2013-06-13 MED ORDER — SODIUM CHLORIDE 0.9 % IJ SOLN: 3.0000 mL | Freq: Two times a day (BID) | INTRAMUSCULAR | Status: DC

## 2013-06-13 MED ORDER — ROCURONIUM BROMIDE 50 MG/5ML IV SOLN
INTRAVENOUS | Status: AC
  Filled 2013-06-13: qty 1

## 2013-06-13 SURGICAL SUPPLY — 43 items
APPLIER CLIP ROT 10 11.4 M/L (STAPLE) ×3
BLADE SURG ROTATE 9660 (MISCELLANEOUS) IMPLANT
CANISTER SUCTION 2500CC (MISCELLANEOUS) ×3 IMPLANT
CHLORAPREP W/TINT 26ML (MISCELLANEOUS) ×3 IMPLANT
CLIP APPLIE ROT 10 11.4 M/L (STAPLE) ×2 IMPLANT
COVER MAYO STAND STRL (DRAPES) IMPLANT
COVER SURGICAL LIGHT HANDLE (MISCELLANEOUS) ×3 IMPLANT
DECANTER SPIKE VIAL GLASS SM (MISCELLANEOUS) ×6 IMPLANT
DERMABOND ADVANCED (GAUZE/BANDAGES/DRESSINGS) ×1
DERMABOND ADVANCED .7 DNX12 (GAUZE/BANDAGES/DRESSINGS) ×2 IMPLANT
DRAPE C-ARM 42X72 X-RAY (DRAPES) IMPLANT
DRAPE UTILITY 15X26 W/TAPE STR (DRAPE) ×6 IMPLANT
DRAPE WARM FLUID 44X44 (DRAPE) ×3 IMPLANT
ELECT REM PT RETURN 9FT ADLT (ELECTROSURGICAL) ×3
ELECTRODE REM PT RTRN 9FT ADLT (ELECTROSURGICAL) ×2 IMPLANT
FILTER SMOKE EVAC LAPAROSHD (FILTER) IMPLANT
GLOVE BIO SURGEON STRL SZ 6 (GLOVE) ×3 IMPLANT
GLOVE BIOGEL PI IND STRL 6.5 (GLOVE) ×4 IMPLANT
GLOVE BIOGEL PI IND STRL 7.0 (GLOVE) ×2 IMPLANT
GLOVE BIOGEL PI INDICATOR 6.5 (GLOVE) ×2
GLOVE BIOGEL PI INDICATOR 7.0 (GLOVE) ×1
GLOVE ECLIPSE 6.5 STRL STRAW (GLOVE) ×3 IMPLANT
GLOVE SURG SS PI 7.0 STRL IVOR (GLOVE) ×3 IMPLANT
GOWN STRL REUS W/ TWL LRG LVL3 (GOWN DISPOSABLE) ×6 IMPLANT
GOWN STRL REUS W/TWL 2XL LVL3 (GOWN DISPOSABLE) ×6 IMPLANT
GOWN STRL REUS W/TWL LRG LVL3 (GOWN DISPOSABLE) ×3
KIT BASIN OR (CUSTOM PROCEDURE TRAY) ×3 IMPLANT
KIT ROOM TURNOVER OR (KITS) ×3 IMPLANT
NS IRRIG 1000ML POUR BTL (IV SOLUTION) ×3 IMPLANT
PAD ARMBOARD 7.5X6 YLW CONV (MISCELLANEOUS) ×3 IMPLANT
POUCH SPECIMEN RETRIEVAL 10MM (ENDOMECHANICALS) ×3 IMPLANT
SCISSORS LAP 5X35 DISP (ENDOMECHANICALS) ×3 IMPLANT
SET CHOLANGIOGRAPH 5 50 .035 (SET/KITS/TRAYS/PACK) IMPLANT
SET IRRIG TUBING LAPAROSCOPIC (IRRIGATION / IRRIGATOR) ×3 IMPLANT
SLEEVE ENDOPATH XCEL 5M (ENDOMECHANICALS) ×3 IMPLANT
SPECIMEN JAR SMALL (MISCELLANEOUS) ×3 IMPLANT
SUT MNCRL AB 4-0 PS2 18 (SUTURE) ×3 IMPLANT
TOWEL OR 17X24 6PK STRL BLUE (TOWEL DISPOSABLE) ×3 IMPLANT
TOWEL OR 17X26 10 PK STRL BLUE (TOWEL DISPOSABLE) ×3 IMPLANT
TRAY LAPAROSCOPIC (CUSTOM PROCEDURE TRAY) ×3 IMPLANT
TROCAR XCEL BLUNT TIP 100MML (ENDOMECHANICALS) ×3 IMPLANT
TROCAR XCEL NON-BLD 11X100MML (ENDOMECHANICALS) ×3 IMPLANT
TROCAR XCEL NON-BLD 5MMX100MML (ENDOMECHANICALS) ×3 IMPLANT

## 2013-06-13 NOTE — Interval H&P Note (Signed)
History and Physical Interval Note:  06/13/2013 12:58 PM  Mary Flowers  has presented today for surgery, with the diagnosis of biliary dyskinesia  The various methods of treatment have been discussed with the patient and family. After consideration of risks, benefits and other options for treatment, the patient has consented to  Procedure(s): LAPAROSCOPIC CHOLECYSTECTOMY WITH INTRAOPERATIVE CHOLANGIOGRAM (N/A) as a surgical intervention .  The patient's history has been reviewed, patient examined, no change in status, stable for surgery.  I have reviewed the patient's chart and labs.  Questions were answered to the patient's satisfaction.     Jeniyah Menor

## 2013-06-13 NOTE — Discharge Instructions (Signed)
[facility]      [facility] °[phone] ° °ABDOMINAL SURGERY: POST OP INSTRUCTIONS ° °Always review your discharge instruction sheet given to you by the facility where your surgery was performed. ° °IF YOU HAVE DISABILITY OR FAMILY LEAVE FORMS, YOU MUST BRING THEM TO THE OFFICE FOR PROCESSING.  PLEASE DO NOT GIVE THEM TO YOUR DOCTOR. ° °1. A prescription for pain medication may be given to you upon discharge.  Take your pain medication as prescribed, if needed.  If narcotic pain medicine is not needed, then you may take acetaminophen (Tylenol) or ibuprofen (Advil) as needed. °2. Take your usually prescribed medications unless otherwise directed. °3. If you need a refill on your pain medication, please contact your pharmacy. They will contact our office to request authorization.  Prescriptions will not be filled after 5pm or on week-ends. °4. You should follow a light diet the first few days after arrival home, such as soup and crackers, pudding, etc.unless your doctor has advised otherwise. A high-fiber, low fat diet can be resumed as tolerated.   Be sure to include lots of fluids daily. Most patients will experience some swelling and bruising on the chest and neck area.  Ice packs will help.  Swelling and bruising can take several days to resolve °5. Most patients will experience some swelling and bruising in the area of the incision. Ice pack will help. Swelling and bruising can take several days to resolve..  °6. It is common to experience some constipation if taking pain medication after surgery.  Increasing fluid intake and taking a stool softener will usually help or prevent this problem from occurring.  A mild laxative (Milk of Magnesia or Miralax) should be taken according to package directions if there are no bowel movements after 48 hours. °7.  You may have steri-strips (small skin tapes) in place directly over the incision.  These strips should be left on the skin for 10-14 days.  If your  surgeon used skin glue on the incision, you may shower in 48 hours.  The glue will flake off over the next 2-3 weeks.  Any sutures or staples will be removed at the office during your follow-up visit. You may find that a light gauze bandage over your incision may keep your staples from being rubbed or pulled. You may shower and replace the bandage daily. °8. ACTIVITIES:  You may resume regular (light) daily activities beginning the next day--such as daily self-care, walking, climbing stairs--gradually increasing activities as tolerated.  You may have sexual intercourse when it is comfortable.  Refrain from any heavy lifting or straining until approved by your doctor. °a. You may drive when you no longer are taking prescription pain medication, you can comfortably wear a seatbelt, and you can safely maneuver your car and apply brakes °b. Return to Work: __________12 weeks if applicable_________________________ °9. You should see your doctor in the office for a follow-up appointment approximately two weeks after your surgery.  Make sure that you call for this appointment within a day or two after you arrive home to insure a convenient appointment time. °OTHER INSTRUCTIONS:  °_____________________________________________________________ °_____________________________________________________________ ° °WHEN TO CALL YOUR DOCTOR: °1. Fever over 101.0 °2. Inability to urinate °3. Nausea and/or vomiting °4. Extreme swelling or bruising °5. Continued bleeding from incision. °6. Increased pain, redness, or drainage from the incision. °7. Difficulty swallowing or breathing °8. Muscle cramping or spasms. °9. Numbness or tingling in hands or feet or around lips. ° °The clinic staff is   available to answer your questions during regular business hours.  Please don’t hesitate to call and ask to speak to one of the nurses if you have concerns. ° °For further questions, please visit [email or web address] ° ° ° °

## 2013-06-13 NOTE — Op Note (Signed)
Laparoscopic Cholecystectomy  Procedure Note  Indications: This patient presents with biliary dyskinesia and will undergo laparoscopic cholecystectomy.  Pre-operative Diagnosis: biliary dyskinesia  Post-operative Diagnosis: Same  Surgeon: Almond LintBYERLY,Tyianna Menefee   Assistants: none  Anesthesia: General endotracheal anesthesia and local  ASA Class: 2  Procedure Details  The patient was seen again in the Holding Room. The risks, benefits, complications, treatment options, and expected outcomes were discussed with the patient. The possibilities of  bleeding, recurrent infection, damage to nearby structures, the need for additional procedures, failure to diagnose a condition, the possible need to convert to an open procedure, and creating a complication requiring transfusion or operation were discussed with the patient. The likelihood of improving the patient's symptoms with return to their baseline status is good.    The patient and/or family concurred with the proposed plan, giving informed consent. The site of surgery properly noted. The patient was taken to Operating Room, and the procedure verified as Laparoscopic Cholecystectomy with Intraoperative Cholangiogram. A Time Out was held and the above information confirmed.  Prior to the induction of general anesthesia, antibiotic prophylaxis was administered. General endotracheal anesthesia was then administered and tolerated well. After the induction, the abdomen was prepped with Chloraprep and draped in the sterile fashion. The patient was positioned in the supine position.  Local anesthetic agent was injected into the skin near the umbilicus and an incision made. We dissected down to the abdominal fascia with blunt dissection.  The fascia was incised vertically and we entered the peritoneal cavity bluntly.  A pursestring suture of 0-Vicryl was placed around the fascial opening.  The Hasson cannula was inserted and secured with the stay suture.   Pneumoperitoneum was then created with CO2 and tolerated well without any adverse changes in the patient's vital signs. An 11-mm port was placed in the subxiphoid position.  Two 5-mm ports were placed in the right upper quadrant. All skin incisions were infiltrated with a local anesthetic agent before making the incision and placing the trocars.   We positioned the patient in reverse Trendelenburg, tilted slightly to the patient's left.  The gallbladder was identified, the fundus grasped and retracted cephalad. Adhesions were lysed bluntly and with the electrocautery where indicated, taking care not to injure any adjacent organs or viscus. The infundibulum was grasped and retracted laterally, exposing the peritoneum overlying the triangle of Calot. This was then divided and exposed in a blunt fashion. A critical view of the cystic duct and cystic artery was obtained.  The cystic duct was clearly identified and bluntly dissected circumferentially. The cystic duct was ligated with a clip distally and three clips proximally.  The cystic artery was identified, dissected free, ligated with clips and divided as well.   The gallbladder was dissected from the liver bed in retrograde fashion with the electrocautery. The gallbladder was removed and placed in an Endocatch bag.  The gallbladder and Endocatch bag were then removed through the umbilical port site.  The liver bed was irrigated and inspected. Hemostasis was achieved with the electrocautery. Copious irrigation was utilized and was repeatedly aspirated until clear.    We again inspected the right upper quadrant for hemostasis.  Pneumoperitoneum was released as we removed the trocars.   The pursestring suture was used to close the umbilical fascia.  4-0 Monocryl was used to close the skin.   The skin was cleaned and dry, and Dermabond was applied. The patient was then extubated and brought to the recovery room in stable condition. Instrument, sponge, and  needle  counts were correct at closure and at the conclusion of the case.   Findings: Mild chronic inflammation  Estimated Blood Loss: min         Drains: none          Specimens: Gallbladder to pathology       Complications: None; patient tolerated the procedure well.         Disposition: PACU - hemodynamically stable.         Condition: stable

## 2013-06-13 NOTE — Anesthesia Preprocedure Evaluation (Addendum)
Anesthesia Evaluation  Patient identified by MRN, date of birth, ID band Patient awake    Reviewed: Allergy & Precautions, H&P , NPO status , Patient's Chart, lab work & pertinent test results  History of Anesthesia Complications (+) PONV and history of anesthetic complications  Airway Mallampati: II TM Distance: >3 FB     Dental  (+) Teeth Intact   Pulmonary Current Smoker,  breath sounds clear to auscultation  Pulmonary exam normal       Cardiovascular negative cardio ROS  Rhythm:Regular Rate:Normal     Neuro/Psych Anxiety Depression    GI/Hepatic Neg liver ROS, GERD-  ,  Endo/Other  negative endocrine ROS  Renal/GU negative Renal ROS     Musculoskeletal   Abdominal Normal abdominal exam  (+)   Peds  Hematology negative hematology ROS (+)   Anesthesia Other Findings   Reproductive/Obstetrics                         Anesthesia Physical Anesthesia Plan  ASA: II  Anesthesia Plan: General   Post-op Pain Management:    Induction: Intravenous  Airway Management Planned: Oral ETT  Additional Equipment:   Intra-op Plan:   Post-operative Plan: Extubation in OR  Informed Consent: I have reviewed the patients History and Physical, chart, labs and discussed the procedure including the risks, benefits and alternatives for the proposed anesthesia with the patient or authorized representative who has indicated his/her understanding and acceptance.   Dental advisory given  Plan Discussed with: CRNA, Anesthesiologist and Surgeon  Anesthesia Plan Comments:         Anesthesia Quick Evaluation

## 2013-06-13 NOTE — Preoperative (Signed)
Beta Blockers   Reason not to administer Beta Blockers:Not Applicable 

## 2013-06-13 NOTE — H&P (View-Only) (Signed)
Chief Complaint  Patient presents with  . Biliary Dyskinesia    HISTORY: Patient is a 28 year old female has had approximately 5 months of epigastric abdominal pain. She she had an ulcer. She went to see GI. She took proton pump inhibitors for approximately 2-4 weeks. She did not notice significant improvement. She has some chronic epigastric discomfort, but primarily has episodic attacks of severe pain, nausea, and vomiting. These are mostly after she ears, around 30-45 minutes. She also noticed worsening with fatty foods. Since she has been eating mostly smoothies and low-fat food, she has had slightly fewer attacks.  She also states that she intermittently has a light-colored stool intermittently has fevers. She has some gassy sensation with occasional loose stools and diarrhea. She denies family history of gallstones. She has undergone ultrasound, which was negative. She's also had a CT scan which showed some slightly prominent mesenteric lymph nodes, and has had a normal EGD. Her HIDA scan showed a normal ejection fraction, but she did have replication of her symptoms with injection of CCK.  Past Medical History  Diagnosis Date  . OCD (obsessive compulsive disorder)   . Anxiety disorder   . PMDD (premenstrual dysphoric disorder)   . Anxiety   . Lymph edema LLE  . Biliary dyskinesia     History reviewed. No pertinent past surgical history.  Current Outpatient Prescriptions  Medication Sig Dispense Refill  . ibuprofen (ADVIL,MOTRIN) 200 MG tablet Take 200 mg by mouth every 6 (six) hours as needed.      . Prenatal Multivit-Min-Fe-FA (PRENATAL VITAMINS PO) Take by mouth daily.      . promethazine (PHENERGAN) 12.5 MG suppository Place 12.5 mg rectally every 6 (six) hours as needed for nausea or vomiting.       No current facility-administered medications for this visit.     No Known Allergies   Family History  Problem Relation Age of Onset  . Cancer Maternal Grandmother   .  Diabetes Maternal Grandfather   . Heart disease Paternal Grandfather   . Stroke Paternal Grandfather      History   Social History  . Marital Status: Married    Spouse Name: N/A    Number of Children: N/A  . Years of Education: N/A   Social History Main Topics  . Smoking status: Current Some Day Smoker    Types: Cigarettes  . Smokeless tobacco: Never Used  . Alcohol Use: 0.6 oz/week    1 Glasses of wine per week     Comment: daily wine use 1 glass  . Drug Use: No  . Sexual Activity: None   Other Topics Concern  . None- [place] from [place]   Social History Narrative  . Emergency planning/management officerolice officer, trying to get pregnant.       REVIEW OF SYSTEMS - PERTINENT POSITIVES ONLY: 12 point review of systems negative other than HPI and PMH except for intermittent fevers, left leg lymphedema, headaches  EXAM: Filed Vitals:   06/03/13 1030  BP: 102/78  Pulse: 86  Temp: 98.1 F (36.7 C)  Resp: 14    Wt Readings from Last 3 Encounters:  04/16/13 151 lb (68.493 kg)  02/17/13 145 lb (65.772 kg)  12/03/12 147 lb 9.6 oz (66.951 kg)     Gen:  No acute distress.  Well nourished and well groomed.   Neurological: Alert and oriented to person, place, and time. Coordination normal.  Head: Normocephalic and atraumatic.  Eyes: Conjunctivae are normal. Pupils are equal, round, and reactive to light.  No scleral icterus.  Neck: Normal range of motion. Neck supple. No tracheal deviation or thyromegaly present.  Cardiovascular: Normal rate, regular rhythm, normal heart sounds and intact distal pulses.  Exam reveals no gallop and no friction rub.  No murmur heard. Respiratory: Effort normal.  No respiratory distress. No chest wall tenderness. Breath sounds normal.  No wheezes, rales or rhonchi.  GI: Soft. Bowel sounds are normal. The abdomen is soft and nontender.  There is no rebound and no guarding.  Musculoskeletal: Normal range of motion. Extremities are nontender.  Lymphadenopathy: No  cervical, preauricular, postauricular or axillary adenopathy is present Skin: Skin is warm and dry. No rash noted. No diaphoresis. No erythema. No pallor. No clubbing, cyanosis, or edema.   Psychiatric: Normal mood and affect. Behavior is normal. Judgment and thought content normal.    LABORATORY RESULTS: Available labs are reviewed   Recent Results (from the past 2160 hour(s))  POCT URINE PREGNANCY     Status: None   Collection Time    04/16/13  4:42 PM      Result Value Ref Range   Preg Test, Ur Negative    HCG, SERUM, QUALITATIVE     Status: None   Collection Time    04/16/13  4:48 PM      Result Value Ref Range   Preg, Serum NEG       RADIOLOGY RESULTS: See E-Chart or I-Site for most recent results.  Images and reports are reviewed.  US Abdomen Complete  05/20/2013   CLINICAL DATA:  Epigastric pain  EXAM: ULTRASOUND ABDOMEN COMPLETE  COMPARISON:  CT abdomen and pelvis April 05, 2009  FINDINGS: Gallbladder:  No gallstones or wall thickening visualized. There is no pericholecystic fluid. No sonographic Murphy sign noted.  Common bile duct:  Diameter: 5 mm. There is no intrahepatic, common hepatic, or common bile duct dilatation.  Liver:  No focal lesion identified. Within normal limits in parenchymal echogenicity.  IVC:  No abnormality visualized.  Pancreas:  No mass or inflammatory focus.  Spleen:  Size and appearance within normal limits.  Right Kidney:  Length: 11.1 cm. Echogenicity within normal limits. No mass or hydronephrosis visualized.  Left Kidney:  Length: 11.4 cm. Echogenicity within normal limits. No mass or hydronephrosis visualized.  Abdominal aorta:  No aneurysm visualized.  Other findings:  There is no demonstrable ascites.  IMPRESSION: Study within normal limits.   Electronically Signed   By: Bretta Bang M.D.   On: 05/20/2013 08:49   Ct Abdomen Pelvis W Contrast  05/24/2013   CLINICAL DATA:  Epigastric pain, fever and rectal bleeding  EXAM: CT ABDOMEN AND  PELVIS WITH CONTRAST  TECHNIQUE: Multidetector CT imaging of the abdomen and pelvis was performed using the standard protocol following bolus administration of intravenous contrast.  CONTRAST:  100 cc of Omni 300  COMPARISON:  05/06/2009  FINDINGS: The lung bases are clear.  No pleural or pericardial effusion.  No focal liver abnormality identified. The gallbladder appears normal. No biliary dilatation. Normal appearance of the pancreas. The spleen appears normal.  The adrenal glands are both normal. The kidneys are both on unremarkable. The urinary bladder appears normal. The uterus and adnexal structures are on unremarkable.  Normal caliber of the abdominal aorta. There is no aneurysm. Multiple small jejunal and ileocolic mesenteric lymph nodes are identified. There is no adenopathy. No pelvic or inguinal adenopathy identified. A small amount of free fluid is identified within the cul-de-sac. No focal fluid collections identified.  The stomach  is normal. The small bowel loops have a normal course and caliber without evidence for bowel obstruction. Normal appearance of the colon. The appendix is visualized and appears normal.  Review of the visualized osseous structures is on unremarkable. There are no aggressive lytic or sclerotic bone lesions identified.  IMPRESSION: 1. Multiple small (sub cm) mesenteric lymph nodes are identified without adenopathy. Correlate for any clinical signs or symptoms of mesenteric adenitis. 2. Small amount of free fluid is noted within the dependent portion of the pelvis. 3. No evidence for bowel obstruction, bowel perforation or abscess formation. 4. The appendix is visualized and appears normal.   Electronically Signed   By: Signa Kell M.D.   On: 05/24/2013 16:50   Nm Hepato W/eject Fract  05/20/2013   CLINICAL DATA:  Abdominal pain with nausea and vomiting  EXAM: NUCLEAR MEDICINE HEPATOBILIARY IMAGING WITH GALLBLADDER EF  Views:  Anterior, right lateral right upper quadrant   Radionuclide: Technetium 52m Choletec  Dose:  5.0 mCi  Route of administration: Intravenous  COMPARISON:  Abdominal ultrasound May 20, 2013  FINDINGS: Liver uptake of radiotracer is normal. There is prompt visualization of gallbladder and small bowel, indicating patency of the cystic and common bile ducts.  A weight based dose, 1.36 mcg, of CCK was administered intravenously with calculation of the computer generated ejection fraction of radiotracer from the gallbladder. The patient did experience pain with intravenous CCK administration. The ejection fraction of radiotracer from the gallbladder is measured at 48.3%, normal greater than 38%.  IMPRESSION: Normal ejection fraction of radiotracer from the gallbladder. Cystic and common bile ducts are patent. Patient did experience pain with intravenous CCK administration.   Electronically Signed   By: Bretta Bang M.D.   On: 05/20/2013 11:02      ASSESSMENT AND PLAN: Biliary dyskinesia Patient does appear to have biliary source of pain given the fact that she has postprandial attacks of epigastric pain, worsening with fatty foods, and pain with CCK injection.  She does not have significant tenderness in her right upper quadrant, but given the other factors, I think that she would benefit from a cholecystectomy.  I reviewed this with the patient. I discussed that when all of her imaging studies appear normal, there is a chance that she would not improve with cholecystectomy.  The surgical procedure was described to the patient in detail.  The patient was given Agricultural engineer. .  I discussed the incision type and location, the location of the gallbladder, the anatomy of the bile ducts and arteries, and the typical progression of surgery.  I discussed the possibility of converting to an open operation.  I advised of the risks of bleeding, infection, damage to other structures (such as the bile duct, intestine or liver), bile leak, need for  other procedures or surgeries, and post op diarrhea/constipation.  We discussed the risk of blood clot.  We discussed the recovery period and post operative restrictions.  The patient was advised against taking blood thinners the week before surgery.           Maudry Diego MD Surgical Oncology, General and Endocrine Surgery [facility]      Visit Diagnoses: 1. Chronic cholecystitis     Primary Care Physician: Tonye Pearson, MD

## 2013-06-13 NOTE — Transfer of Care (Signed)
Immediate Anesthesia Transfer of Care Note  Patient: Mary Flowers  Procedure(s) Performed: Procedure(s): LAPAROSCOPIC CHOLECYSTECTOMY  Patient Location: PACU  Anesthesia Type:General  Level of Consciousness: awake, alert  and oriented  Airway & Oxygen Therapy: Patient Spontanous Breathing and Patient connected to nasal cannula oxygen  Post-op Assessment: Report given to PACU RN  Post vital signs: Reviewed and stable  Complications: No apparent anesthesia complications

## 2013-06-13 NOTE — Anesthesia Postprocedure Evaluation (Signed)
  Anesthesia Post-op Note  Patient: Mary Flowers  Procedure(s) Performed: Procedure(s): LAPAROSCOPIC CHOLECYSTECTOMY  Patient Location: PACU  Anesthesia Type:General  Level of Consciousness: awake  Airway and Oxygen Therapy: Patient Spontanous Breathing  Post-op Pain: mild  Post-op Assessment: Post-op Vital signs reviewed  Post-op Vital Signs: Reviewed  Complications: No apparent anesthesia complications

## 2013-06-17 ENCOUNTER — Encounter: Payer: Self-pay | Admitting: Internal Medicine

## 2013-06-18 ENCOUNTER — Encounter (HOSPITAL_COMMUNITY): Payer: Self-pay | Admitting: General Surgery

## 2013-06-18 ENCOUNTER — Telehealth (INDEPENDENT_AMBULATORY_CARE_PROVIDER_SITE_OTHER): Payer: Self-pay

## 2013-06-18 NOTE — Telephone Encounter (Signed)
Patient states she forgot to do her breathing exercises after her surgery and now she feels she can not take a deep breath, denies chest , arm , jaw pain. Advise her to start her exercises and to also ambulate as much as possible , she is to go to the Ed if symptoms becomes worse. Patient verbalizes understanding

## 2013-06-28 ENCOUNTER — Ambulatory Visit (INDEPENDENT_AMBULATORY_CARE_PROVIDER_SITE_OTHER): Payer: 59 | Admitting: General Surgery

## 2013-06-28 ENCOUNTER — Encounter (INDEPENDENT_AMBULATORY_CARE_PROVIDER_SITE_OTHER): Payer: Self-pay | Admitting: General Surgery

## 2013-06-28 VITALS — BP 126/80 | HR 75 | Temp 97.4°F | Resp 16 | Ht 68.0 in | Wt 154.0 lb

## 2013-06-28 DIAGNOSIS — K828 Other specified diseases of gallbladder: Secondary | ICD-10-CM

## 2013-06-28 NOTE — Assessment & Plan Note (Addendum)
Follow up as needed.  She was advised that she can take imodium as needed for soft stools. I advised her to call if she develops diarrhea.

## 2013-06-28 NOTE — Progress Notes (Signed)
HISTORY: Pt is doing well overall.  She is having some overall soft stools, but no diarrhea.  She is no longer having abdominal pain or nausea.  She is not having fever/ chills.      EXAM: General:  Alert and oriented.  Incision:  Healing well.     PATHOLOGY: Gallbladder - BENIGN GALLBLADDER WITH MINIMAL CHRONIC INFLAMMATION - NEGATIVE FOR CHOLELITHIASIS. - INCIDENTAL BENIGN LIVER.   ASSESSMENT AND PLAN:   Biliary dyskinesia Follow up as needed.  She was advised that she can take imodium as needed for soft stools. I advised her to call if she develops diarrhea.        Maudry DiegoFaera L Kelci Petrella, MD Surgical Oncology, General & Endocrine Surgery [facility]  DOOLITTLE, Harrel LemonOBERT P, MD Tonye Pearsonoolittle, Robert P, MD

## 2013-07-03 ENCOUNTER — Encounter (INDEPENDENT_AMBULATORY_CARE_PROVIDER_SITE_OTHER): Payer: Self-pay

## 2013-12-03 ENCOUNTER — Telehealth: Payer: Self-pay

## 2013-12-03 NOTE — Telephone Encounter (Signed)
Dr. Merla Riches,  Patient needs a doctors note to be out of work on sick leave for two weeks. Does not require FMLA as she has sick leave to take.    Human Resources - [place] [employer]  -   [phone]  Patients Cell Phone

## 2013-12-03 NOTE — Telephone Encounter (Signed)
LM for pt to RTC- no office visit since May.

## 2014-01-21 ENCOUNTER — Other Ambulatory Visit (HOSPITAL_COMMUNITY): Payer: Self-pay | Admitting: Obstetrics and Gynecology

## 2014-01-21 DIAGNOSIS — Z3141 Encounter for fertility testing: Secondary | ICD-10-CM

## 2014-01-27 ENCOUNTER — Ambulatory Visit (HOSPITAL_COMMUNITY)
Admission: RE | Admit: 2014-01-27 | Discharge: 2014-01-27 | Disposition: A | Discharge status: Home / Self Care | Payer: 59 | Source: Ambulatory Visit | Attending: Obstetrics and Gynecology | Admitting: Obstetrics and Gynecology

## 2014-01-27 DIAGNOSIS — N979 Female infertility, unspecified: Secondary | ICD-10-CM | POA: Diagnosis not present

## 2014-01-27 DIAGNOSIS — Z3141 Encounter for fertility testing: Secondary | ICD-10-CM

## 2014-01-27 MED ORDER — IOHEXOL 300 MG/ML  SOLN
20.0000 mL | Freq: Once | INTRAMUSCULAR | Status: AC | PRN
  Administered 2014-01-27: 20 mL

## 2014-07-10 ENCOUNTER — Encounter (HOSPITAL_COMMUNITY): Payer: Self-pay | Admitting: *Deleted

## 2014-07-10 ENCOUNTER — Inpatient Hospital Stay (HOSPITAL_COMMUNITY): Payer: 59

## 2014-07-10 ENCOUNTER — Inpatient Hospital Stay (HOSPITAL_COMMUNITY)
Admission: AD | Admit: 2014-07-10 | Discharge: 2014-07-10 | Disposition: A | Discharge status: Home / Self Care | Payer: 59 | Source: Ambulatory Visit | Attending: Obstetrics and Gynecology | Admitting: Obstetrics and Gynecology

## 2014-07-10 DIAGNOSIS — O26891 Other specified pregnancy related conditions, first trimester: Secondary | ICD-10-CM

## 2014-07-10 DIAGNOSIS — Z3A01 Less than 8 weeks gestation of pregnancy: Secondary | ICD-10-CM

## 2014-07-10 DIAGNOSIS — O9989 Other specified diseases and conditions complicating pregnancy, childbirth and the puerperium: Secondary | ICD-10-CM | POA: Insufficient documentation

## 2014-07-10 DIAGNOSIS — R102 Pelvic and perineal pain: Secondary | ICD-10-CM | POA: Diagnosis not present

## 2014-07-10 DIAGNOSIS — O3680X0 Pregnancy with inconclusive fetal viability, not applicable or unspecified: Secondary | ICD-10-CM

## 2014-07-10 DIAGNOSIS — R109 Unspecified abdominal pain: Secondary | ICD-10-CM | POA: Diagnosis present

## 2014-07-10 LAB — URINALYSIS, ROUTINE W REFLEX MICROSCOPIC
Bilirubin Urine: NEGATIVE
GLUCOSE, UA: NEGATIVE mg/dL
Ketones, ur: NEGATIVE mg/dL
LEUKOCYTES UA: NEGATIVE
Nitrite: NEGATIVE
Protein, ur: NEGATIVE mg/dL
Urobilinogen, UA: 0.2 mg/dL (ref 0.0–1.0)
pH: 5.5 (ref 5.0–8.0)

## 2014-07-10 LAB — URINE MICROSCOPIC-ADD ON

## 2014-07-10 LAB — HCG, QUANTITATIVE, PREGNANCY: hCG, Beta Chain, Quant, S: 245 m[IU]/mL — ABNORMAL HIGH (ref <5)

## 2014-07-10 LAB — CBC
HCT: 40.2 % (ref 36.0–46.0)
Hemoglobin: 14.2 g/dL (ref 12.0–15.0)
MCH: 32.1 pg (ref 26.0–34.0)
MCHC: 35.3 g/dL (ref 30.0–36.0)
MCV: 91 fL (ref 78.0–100.0)
Platelets: 285 10*3/uL (ref 150–400)
RBC: 4.42 MIL/uL (ref 3.87–5.11)
RDW: 11.3 % — ABNORMAL LOW (ref 11.5–15.5)
WBC: 9.2 10*3/uL (ref 4.0–10.5)

## 2014-07-10 LAB — WET PREP, GENITAL
CLUE CELLS WET PREP: NONE SEEN
Trich, Wet Prep: NONE SEEN
YEAST WET PREP: NONE SEEN

## 2014-07-10 LAB — POCT PREGNANCY, URINE: PREG TEST UR: POSITIVE — AB

## 2014-07-10 NOTE — MAU Provider Note (Signed)
History     CSN: [id number]  Arrival date and time: 07/10/14 1932   First Provider Initiated Contact with Patient 07/10/14 2133      No chief complaint on file.  HPI Comments: Mary Flowers is a 29 y.o. G1P0 at 4 weeks and 3 days who presents today with cramping. She denies any vaginal bleeding.   LMP 06/09/14   Abdominal Pain This is a new problem. The current episode started in the past 7 days. The onset quality is sudden. The problem has been waxing and waning. The pain is located in the suprapubic region. The pain is at a severity of 3/10. The quality of the pain is cramping. Pertinent negatives include no constipation, diarrhea, dysuria, frequency, nausea or vomiting. Exacerbated by: worrying about it.  The pain is relieved by nothing. She has tried acetaminophen for the symptoms. The treatment provided no relief.    Past Medical History  Diagnosis Date  . OCD (obsessive compulsive disorder)   . Anxiety disorder   . PMDD (premenstrual dysphoric disorder)   . Anxiety   . Lymph edema LLE  . Biliary dyskinesia   . Complication of anesthesia   . PONV (postoperative nausea and vomiting)   . GERD (gastroesophageal reflux disease)     Past Surgical History  Procedure Laterality Date  . Wisdom tooth extraction    . Cholecystectomy  06/13/2013    Procedure: LAPAROSCOPIC CHOLECYSTECTOMY;  Surgeon: Almond Lint, MD;  Location: [facility] OR;  Service: General;;    Family History  Problem Relation Age of Onset  . Cancer Maternal Grandmother   . Diabetes Maternal Grandfather   . Heart disease Paternal Grandfather   . Stroke Paternal Grandfather     History  Substance Use Topics  . Smoking status: Current Some Day Smoker -- 0.40 packs/day    Types: Cigarettes  . Smokeless tobacco: Never Used  . Alcohol Use: Yes     Comment: daily wine use 1 glass    Allergies: No Known Allergies  Prescriptions prior to admission  Medication Sig Dispense Refill Last Dose  . calcium carbonate  (TUMS - DOSED IN MG ELEMENTAL CALCIUM) 500 MG chewable tablet Chew 2 tablets by mouth 2 (two) times daily as needed for indigestion or heartburn.   Past Month at Unknown time  . diphenhydrAMINE (SOMINEX) 25 MG tablet Take 50-100 mg by mouth at bedtime as needed for sleep.   07/09/2014 at Unknown time  . ibuprofen (ADVIL,MOTRIN) 200 MG tablet Take 800 mg by mouth every 6 (six) hours as needed.    Past Week at Unknown time  . Prenatal Vit-Fe Fumarate-FA (PRENATAL MULTIVITAMIN) TABS tablet Take 1 tablet by mouth daily at 12 noon.   07/10/2014 at Unknown time  . sertraline (ZOLOFT) 100 MG tablet Take 100 mg by mouth daily.   07/09/2014 at Unknown time  . oxyCODONE-acetaminophen (ROXICET) 5-325 MG per tablet Take 1-2 tablets by mouth every 4 (four) hours as needed for severe pain. (Patient not taking: Reported on 07/10/2014) 30 tablet 0 Not Taking at Unknown time    Review of Systems  Gastrointestinal: Positive for abdominal pain. Negative for nausea, vomiting, diarrhea and constipation.  Genitourinary: Negative for dysuria, urgency and frequency.   Physical Exam   Blood pressure 141/87, pulse 83, temperature 98.6 F (37 C), temperature source Oral, resp. rate 16, height  (1.727 m), weight 66.679 kg (147 lb), last menstrual period 06/09/2014, SpO2 99 %.  Physical Exam  Nursing note and vitals reviewed. Constitutional: She  is oriented to person, place, and time. She appears well-developed and well-nourished. No distress.  HENT:  Head: Normocephalic.  Cardiovascular: Normal rate.   Respiratory: Effort normal.  GI: Soft. There is no tenderness. There is no rebound.  Genitourinary:   External: no lesion Vagina: small amount of white discharge Cervix: pink, smooth, no CMT Uterus: NSSC Adnexa: NT   Neurological: She is alert and oriented to person, place, and time.  Skin: Skin is warm and dry.  Psychiatric: She has a normal mood and affect.   Results for orders placed or performed during the  hospital encounter of 07/10/14 (from the past 24 hour(s))  Urinalysis, Routine w reflex microscopic     Status: Abnormal   Collection Time: 07/10/14  8:15 PM  Result Value Ref Range   Color, Urine YELLOW YELLOW   APPearance CLEAR CLEAR   Specific Gravity, Urine <1.005 (L) 1.005 - 1.030   pH 5.5 5.0 - 8.0   Glucose, UA NEGATIVE NEGATIVE mg/dL   Hgb urine dipstick TRACE (A) NEGATIVE   Bilirubin Urine NEGATIVE NEGATIVE   Ketones, ur NEGATIVE NEGATIVE mg/dL   Protein, ur NEGATIVE NEGATIVE mg/dL   Urobilinogen, UA 0.2 0.0 - 1.0 mg/dL   Nitrite NEGATIVE NEGATIVE   Leukocytes, UA NEGATIVE NEGATIVE  Urine microscopic-add on     Status: None   Collection Time: 07/10/14  8:15 PM  Result Value Ref Range   Squamous Epithelial / LPF RARE RARE   WBC, UA 0-2 <3 WBC/hpf   RBC / HPF 0-2 <3 RBC/hpf   Bacteria, UA RARE RARE  Pregnancy, urine POC     Status: Abnormal   Collection Time: 07/10/14  8:23 PM  Result Value Ref Range   Preg Test, Ur POSITIVE (A) NEGATIVE  CBC     Status: Abnormal   Collection Time: 07/10/14  8:50 PM  Result Value Ref Range   WBC 9.2 4.0 - 10.5 K/uL   RBC 4.42 3.87 - 5.11 MIL/uL   Hemoglobin 14.2 12.0 - 15.0 g/dL   HCT 16.1 09.6 - 04.5 %   MCV 91.0 78.0 - 100.0 fL   MCH 32.1 26.0 - 34.0 pg   MCHC 35.3 30.0 - 36.0 g/dL   RDW 40.9 (L) 81.1 - 91.4 %   Platelets 285 150 - 400 K/uL  ABO/Rh     Status: None (Preliminary result)   Collection Time: 07/10/14  8:50 PM  Result Value Ref Range   ABO/RH(D) AB POS   hCG, quantitative, pregnancy     Status: Abnormal   Collection Time: 07/10/14  8:50 PM  Result Value Ref Range   hCG, Beta Chain, Quant, S 245 (H) <5 mIU/mL  Wet prep, genital     Status: Abnormal   Collection Time: 07/10/14  9:40 PM  Result Value Ref Range   Yeast Wet Prep HPF POC NONE SEEN NONE SEEN   Trich, Wet Prep NONE SEEN NONE SEEN   Clue Cells Wet Prep HPF POC NONE SEEN NONE SEEN   WBC, Wet Prep HPF POC FEW (A) NONE SEEN   US Ob Comp Less 14  Wks  07/10/2014   CLINICAL DATA:  Severe pelvic pain for 1 day and first-trimester pregnancy. 4 weeks 3 days gestational age by dates and quantitative beta HCG of 245.  EXAM: OBSTETRIC <14 WK [place] AND TRANSVAGINAL OB US  TECHNIQUE: Both transabdominal and transvaginal ultrasound examinations were performed for complete evaluation of the gestation as well as the maternal uterus, adnexal regions, and pelvic cul-de-sac.  Transvaginal technique was performed to assess early pregnancy.  COMPARISON:  None.  FINDINGS: No intra or extrauterine gestational sac is identified. No adnexal mass. The ovaries are symmetric and normal in size. There is trace simple appearing pelvic fluid.  IMPRESSION: 1. Pregnancy of unknown location. Differential considerations include intrauterine gestation too early to be sonographically visualized, spontaneous abortion, or ectopic pregnancy. Consider follow-up ultrasound in 10 days and serial quantitative beta HCG follow-up. 2. Trace, simple pelvic fluid.   Electronically Signed   By: Marnee SpringJonathon  Watts M.D.   On: 07/10/2014 23:43   [place] Ob Transvaginal  07/10/2014   CLINICAL DATA:  Severe pelvic pain for 1 day and first-trimester pregnancy. 4 weeks 3 days gestational age by dates and quantitative beta HCG of 245.  EXAM: OBSTETRIC <14 WK US AND TRANSVAGINAL OB US  TECHNIQUE: Both transabdominal and transvaginal ultrasound examinations were performed for complete evaluation of the gestation as well as the maternal uterus, adnexal regions, and pelvic cul-de-sac. Transvaginal technique was performed to assess early pregnancy.  COMPARISON:  None.  FINDINGS: No intra or extrauterine gestational sac is identified. No adnexal mass. The ovaries are symmetric and normal in size. There is trace simple appearing pelvic fluid.  IMPRESSION: 1. Pregnancy of unknown location. Differential considerations include intrauterine gestation too early to be sonographically visualized, spontaneous abortion, or ectopic  pregnancy. Consider follow-up ultrasound in 10 days and serial quantitative beta HCG follow-up. 2. Trace, simple pelvic fluid.   Electronically Signed   By: Marnee SpringJonathon  Watts M.D.   On: 07/10/2014 23:43     MAU Course  Procedures  MDM 2343: D/W Dr. Renaldo FiddlerAdkins, ok for DC home FU in the office on Monday or Tuesday   Assessment and Plan   1. Pregnancy of unknown anatomic location   2. Pelvic pain affecting pregnancy in first trimester, antepartum    DC home Ectopic precautions  Return to MAU as needed FU with the office on Monday or Tuesday  Follow-up Information    Follow up with Zelphia CairoADKINS,GRETCHEN, MD.   Specialty:  Obstetrics and Gynecology   Why:  Call the office for an appointment on Monday or Tuesday    Contact information:   [address], [address] [place] [id number] [phone]        Tawnya CrookHogan, Heather Donovan 07/10/2014, 9:34 PM

## 2014-07-10 NOTE — Discharge Instructions (Signed)
°Ectopic Pregnancy °An ectopic pregnancy is when the fertilized egg attaches (implants) outside the uterus. Most ectopic pregnancies occur in the fallopian tube. Rarely do ectopic pregnancies occur on the ovary, intestine, pelvis, or cervix. In an ectopic pregnancy, the fertilized egg does not have the ability to develop into a normal, healthy baby.  °A ruptured ectopic pregnancy is one in which the fallopian tube gets torn or bursts and results in internal bleeding. Often there is intense abdominal pain, and sometimes, vaginal bleeding. Having an ectopic pregnancy can be life threatening. If left untreated, this dangerous condition can lead to a blood transfusion, abdominal surgery, or even death. °CAUSES  °Damage to the fallopian tubes is the suspected cause in most ectopic pregnancies.  °RISK FACTORS °Depending on your circumstances, the risk of having an ectopic pregnancy will vary. The level of risk can be divided into three categories. °High Risk °· You have gone through infertility treatment. °· You have had a previous ectopic pregnancy. °· You have had previous tubal surgery. °· You have had previous surgery to have the fallopian tubes tied (tubal ligation). °· You have tubal problems or diseases. °· You have been exposed to DES. DES is a medicine that was used until 1971 and had effects on babies whose mothers took the medicine. °· You become pregnant while using an intrauterine device (IUD) for birth control.  °Moderate Risk °· You have a history of infertility. °· You have a history of a sexually transmitted infection (STI). °· You have a history of pelvic inflammatory disease (PID). °· You have scarring from endometriosis. °· You have multiple sexual partners. °· You smoke.  °Low Risk °· You have had previous pelvic surgery. °· You use vaginal douching. °· You became sexually active before 31 years of age. °SIGNS AND SYMPTOMS  °An ectopic pregnancy should be suspected in anyone who has missed a period  and has abdominal pain or bleeding. °· You may experience normal pregnancy symptoms, such as: °¨ Nausea. °¨ Tiredness. °¨ Breast tenderness. °· Other symptoms may include: °¨ Pain with intercourse. °¨ Irregular vaginal bleeding or spotting. °¨ Cramping or pain on one side or in the lower abdomen. °¨ Fast heartbeat. °¨ Passing out while having a bowel movement. °· Symptoms of a ruptured ectopic pregnancy and internal bleeding may include: °¨ Sudden, severe pain in the abdomen and pelvis. °¨ Dizziness or fainting. °¨ Pain in the shoulder area. °DIAGNOSIS  °Tests that may be performed include: °· A pregnancy test. °· An ultrasound test. °· Testing the specific level of pregnancy hormone in the bloodstream. °· Taking a sample of uterus tissue (dilation and curettage, D&C). °· Surgery to perform a visual exam of the inside of the abdomen using a thin, lighted tube with a tiny camera on the end (laparoscope). °TREATMENT  °An injection of a medicine called methotrexate may be given. This medicine causes the pregnancy tissue to be absorbed. It is given if: °· The diagnosis is made early. °· The fallopian tube has not ruptured. °· You are considered to be a good candidate for the medicine. °Usually, pregnancy hormone blood levels are checked after methotrexate treatment. This is to be sure the medicine is effective. It may take 4-6 weeks for the pregnancy to be absorbed (though most pregnancies will be absorbed by 3 weeks). °Surgical treatment may be needed. A laparoscope may be used to remove the pregnancy tissue. If severe internal bleeding occurs, a cut (incision) may be made in the lower abdomen (laparotomy), and the ectopic   pregnancy is removed. This stops the bleeding. Part of the fallopian tube, or the whole tube, may be removed as well (salpingectomy). After surgery, pregnancy hormone tests may be done to be sure there is no pregnancy tissue left. You may receive a Rho (D) immune globulin shot if you are Rh negative  and the father is Rh positive, or if you do not know the Rh type of the father. This is to prevent problems with any future pregnancy. °SEEK IMMEDIATE MEDICAL CARE IF:  °You have any symptoms of an ectopic pregnancy. This is a medical emergency. °MAKE SURE YOU: °· Understand these instructions. °· Will watch your condition. °· Will get help right away if you are not doing well or get worse. °Document Released: 04/28/2004 Document Revised: 08/05/2013 Document Reviewed: 10/18/2012 °ExitCare® Patient Information ©2015 [employer]. This information is not intended to replace advice given to you by your health care provider. Make sure you discuss any questions you have with your health care provider. ° ° °

## 2014-07-10 NOTE — MAU Note (Signed)
Pt states she had a positive home preg test x 2. Reports lower abd cramping. LMP 06/09/2014

## 2014-07-10 NOTE — MAU Note (Signed)
Pt. Had a positive pregnancy test.  Pt. Has been having cramping for the last 2 days.

## 2014-07-11 LAB — GC/CHLAMYDIA PROBE AMP (~~LOC~~) NOT AT ARMC
CHLAMYDIA, DNA PROBE: NEGATIVE
Neisseria Gonorrhea: NEGATIVE

## 2014-07-11 LAB — ABO/RH: ABO/RH(D): AB POS

## 2014-08-21 ENCOUNTER — Other Ambulatory Visit: Payer: Self-pay | Admitting: Obstetrics and Gynecology

## 2014-08-22 LAB — CYTOLOGY - PAP

## 2015-03-02 ENCOUNTER — Encounter: Payer: Self-pay | Admitting: Internal Medicine

## 2015-03-24 IMAGING — RF DG HYSTEROGRAM
5 series · 5 of 5 positions shown · non-contrast
Comparison: CT 05/24/2013

CLINICAL DATA: Infertility

EXAM:
HYSTEROSALPINGOGRAM
TECHNIQUE: Hysterosalpingogram was performed by the ordering physician under
fluoroscopy. Fluoroscopic images were submitted for radiologic
interpretation following the procedure. Please see the procedural
report for the amount of contrast and the fluoroscopy time utilized.

[Series 1: run · 1 of 1 slices shown (1 of 5)]
[im 1/1]
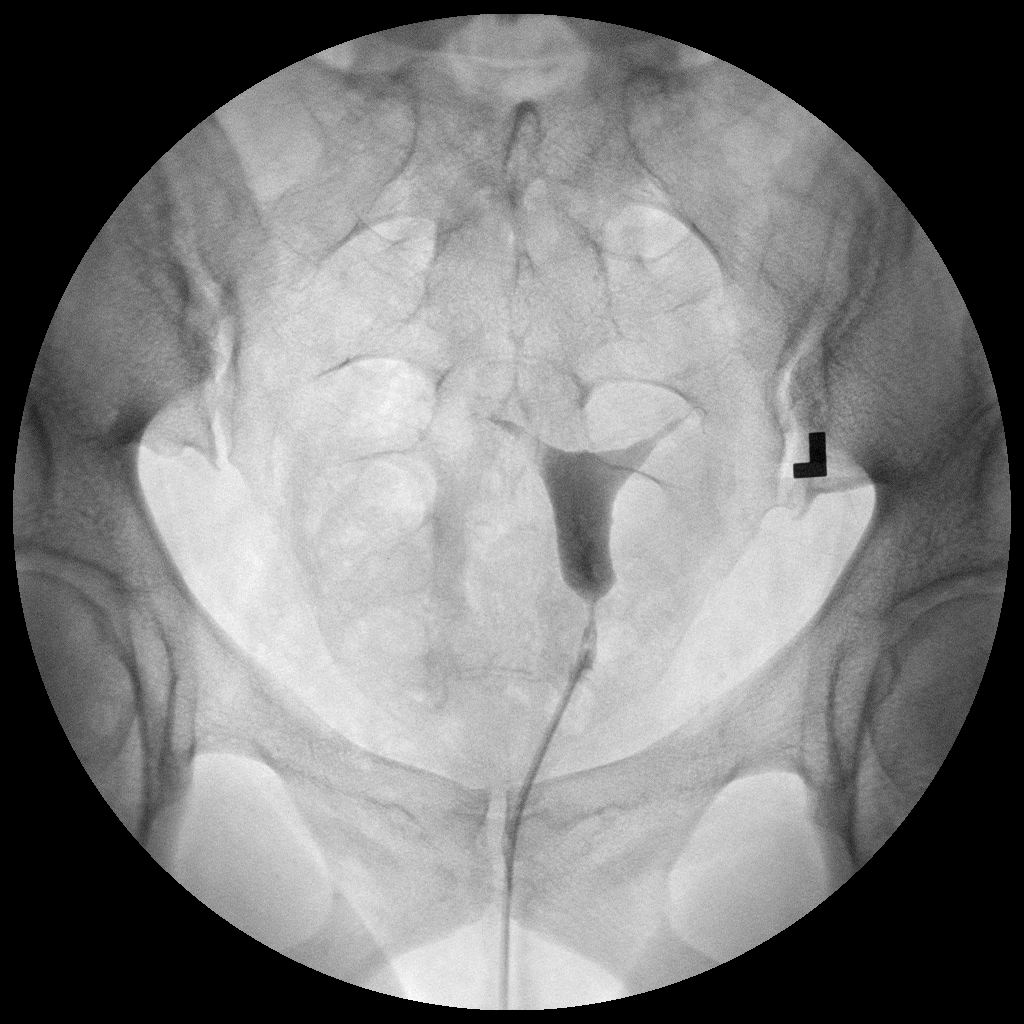

[Series 2: run · 1 of 1 slices shown (2 of 5)]
[im 1/1]
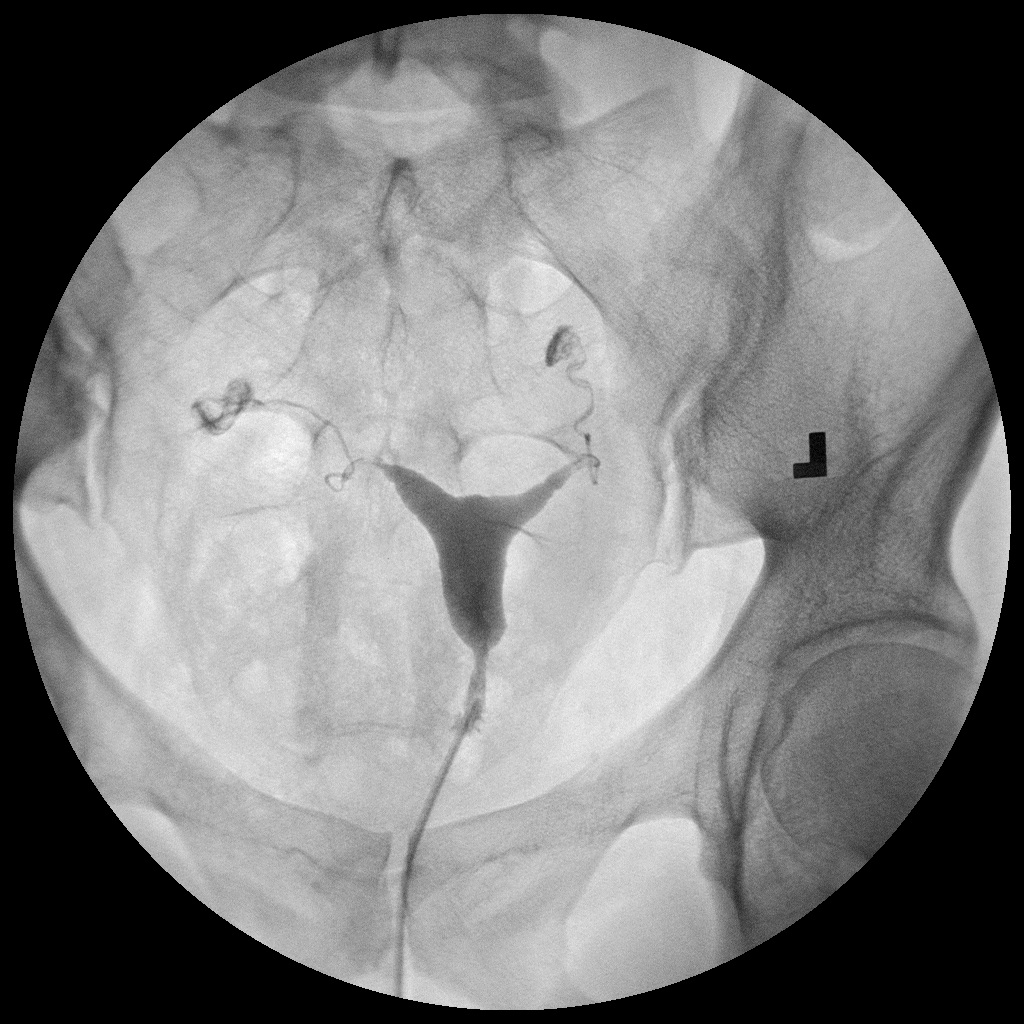

[Series 3: run · 1 of 1 slices shown (3 of 5)]
[im 1/1]
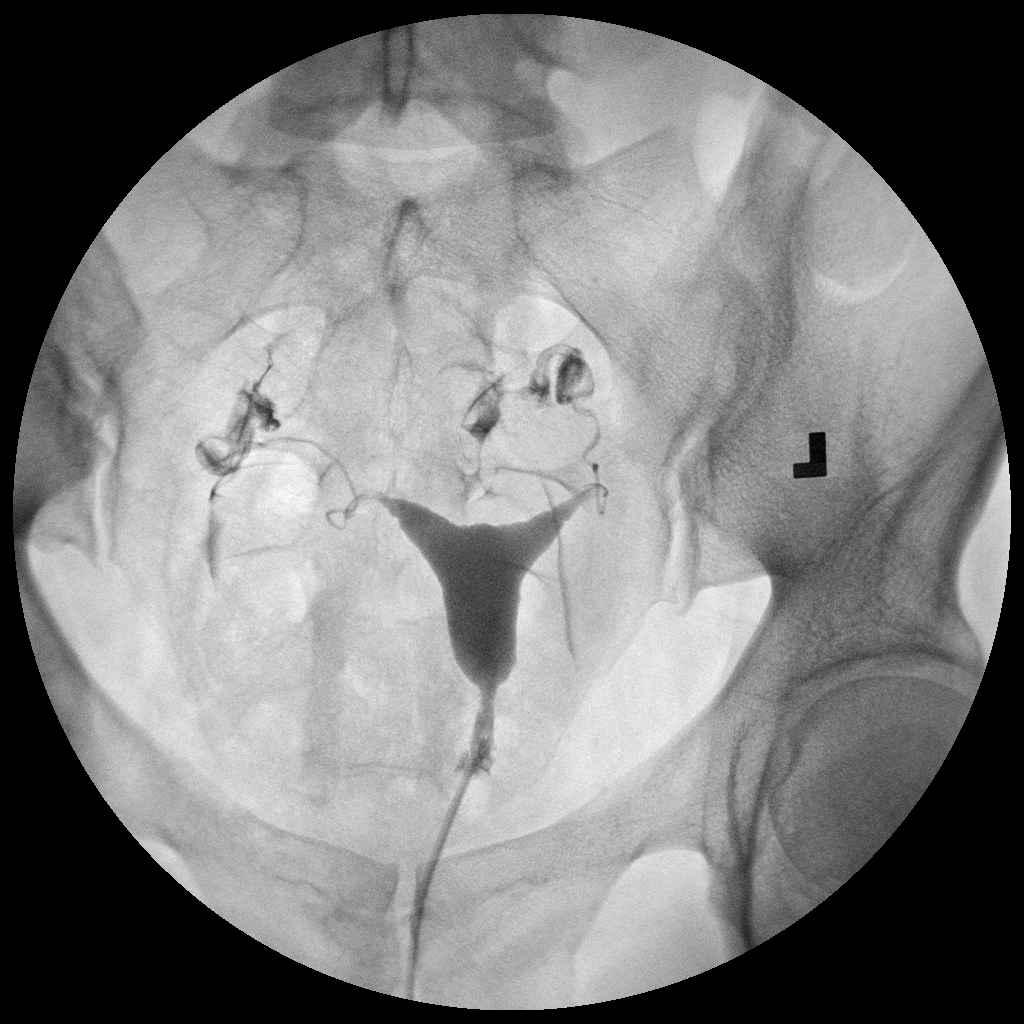

[Series 4: run · 1 of 1 slices shown (4 of 5)]
[im 1/1]
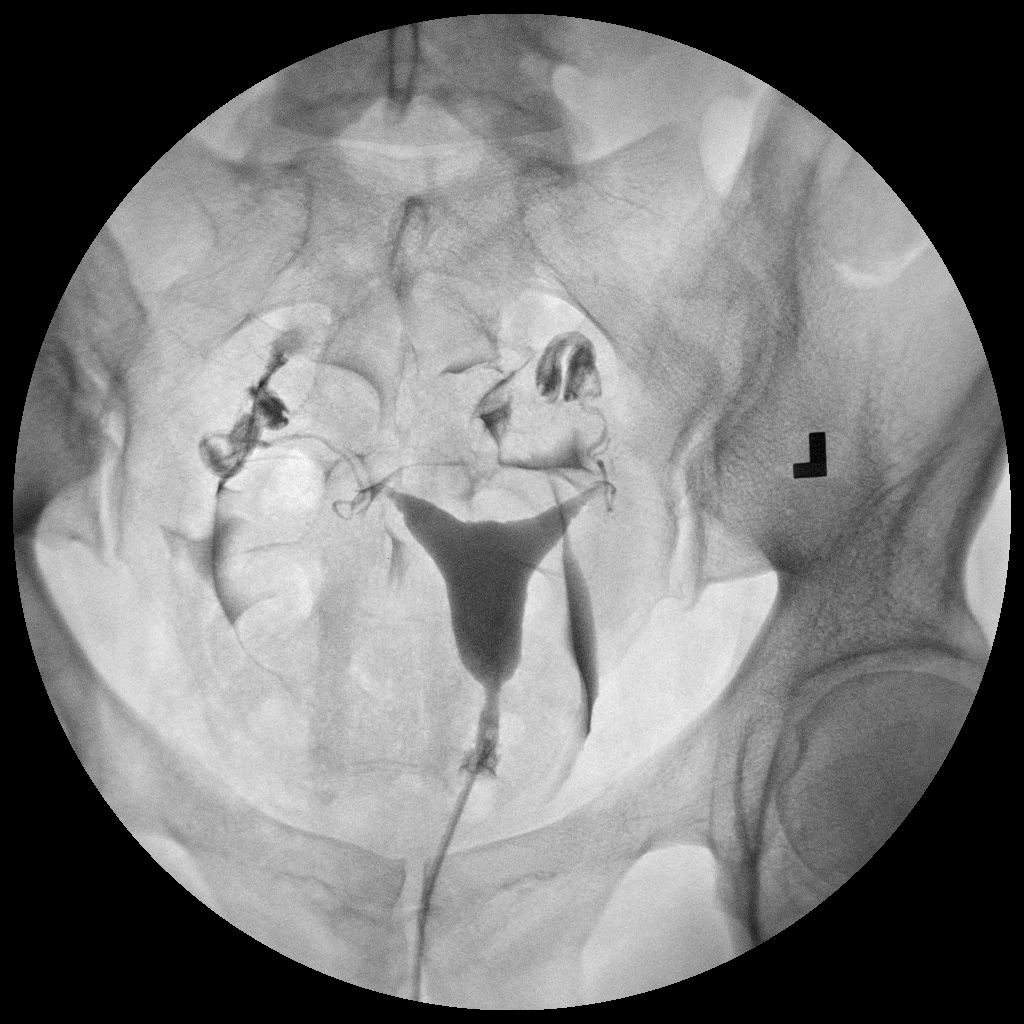

[Series 5: run · 1 of 1 slices shown (5 of 5)]
[im 1/1]
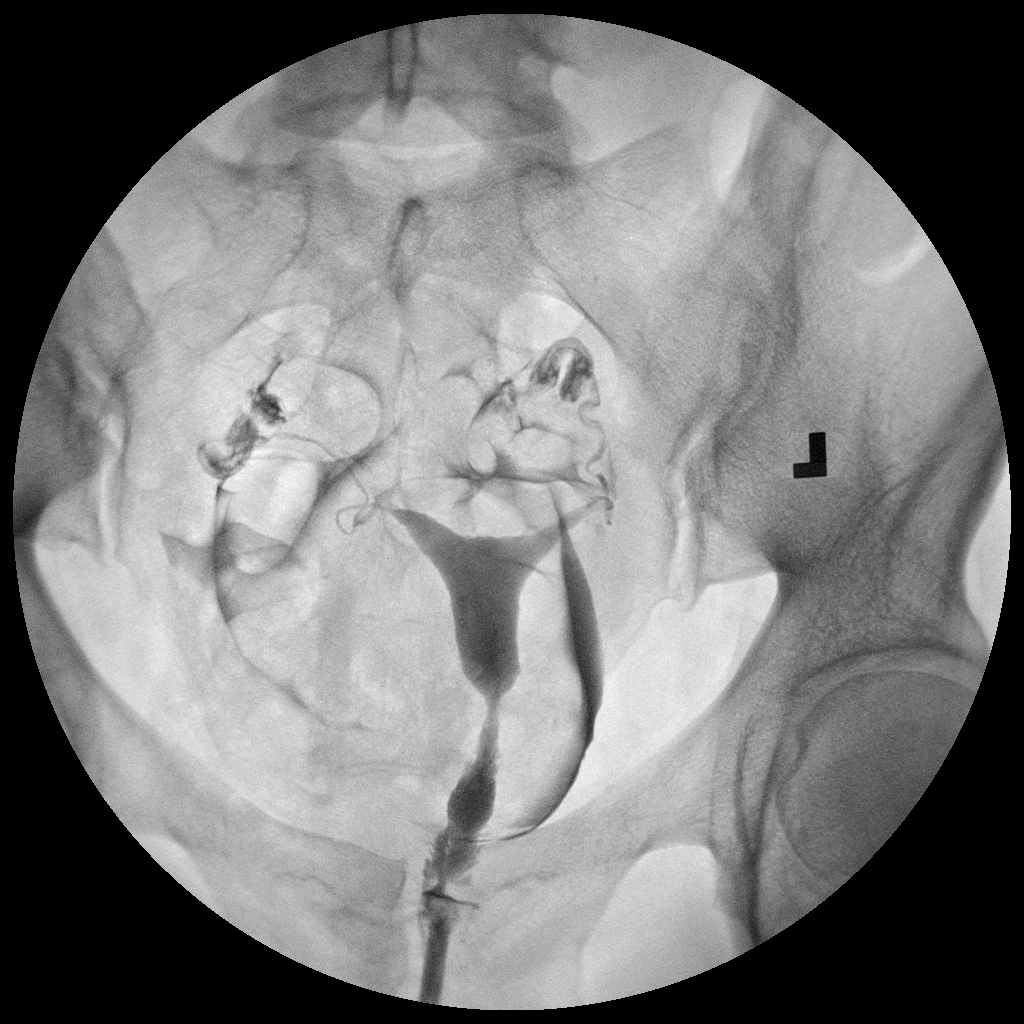

[5 of 5 positions shown; findings below may reference images not displayed]

FINDINGS: The endometrial cavity is normal in appearance and contour.

Opacification of both fallopian tubes is seen. Both tubes appear
normal. Intraperitoneal spill of contrast from both fallopian tubes
is demonstrated.
IMPRESSION: Patent bilateral fallopian tubes.

## 2015-05-15 ENCOUNTER — Encounter (HOSPITAL_COMMUNITY): Payer: Self-pay | Admitting: *Deleted

## 2015-09-04 IMAGING — US US OB COMP LESS 14 WK
1 series · 14 of 28 positions shown · non-contrast
Comparison: None.

CLINICAL DATA: Severe pelvic pain for 1 day and first-trimester
pregnancy. 4 weeks 3 days gestational age by dates and quantitative
beta HCG of 245.

EXAM:
OBSTETRIC <14 WK US AND TRANSVAGINAL OB US
TECHNIQUE: Both transabdominal and transvaginal ultrasound examinations were
performed for complete evaluation of the gestation as well as the
maternal uterus, adnexal regions, and pelvic cul-de-sac.
Transvaginal technique was performed to assess early pregnancy.

[Series 1: us ob comp less 14 wks · 14 of 44 slices shown]
[im 2/44]
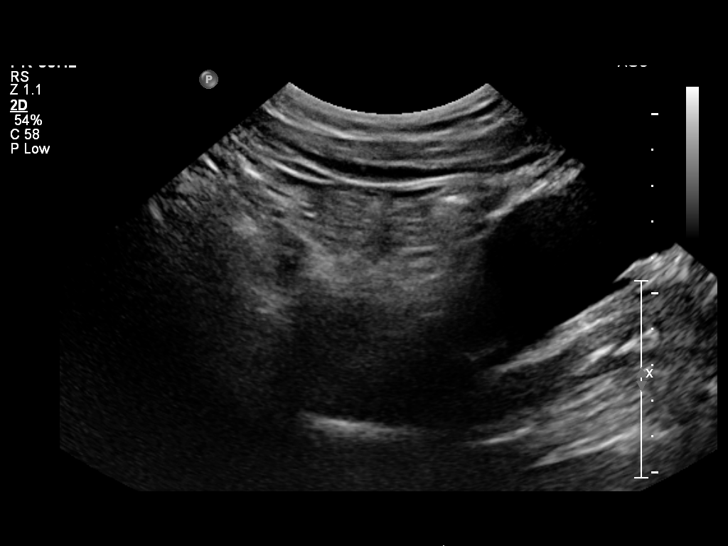
[im 5/44]
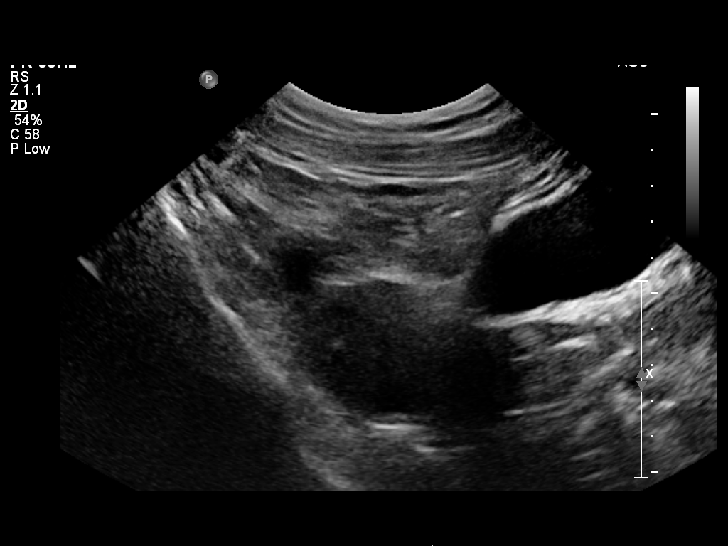
[im 8/44]
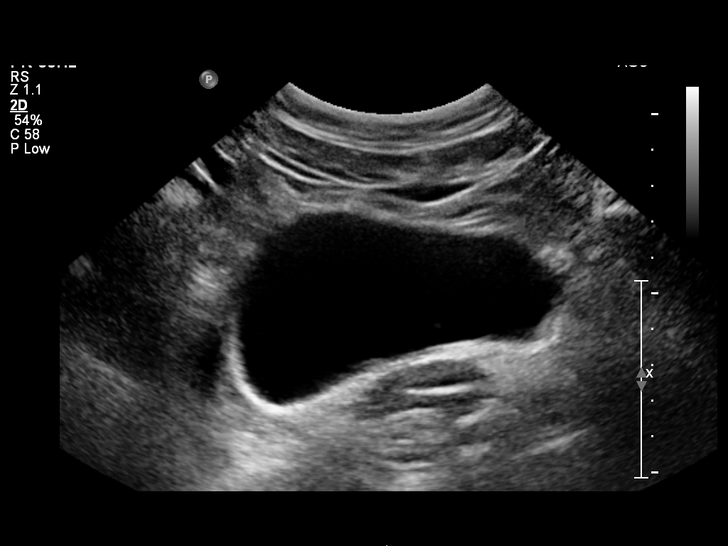
[im 12/44]
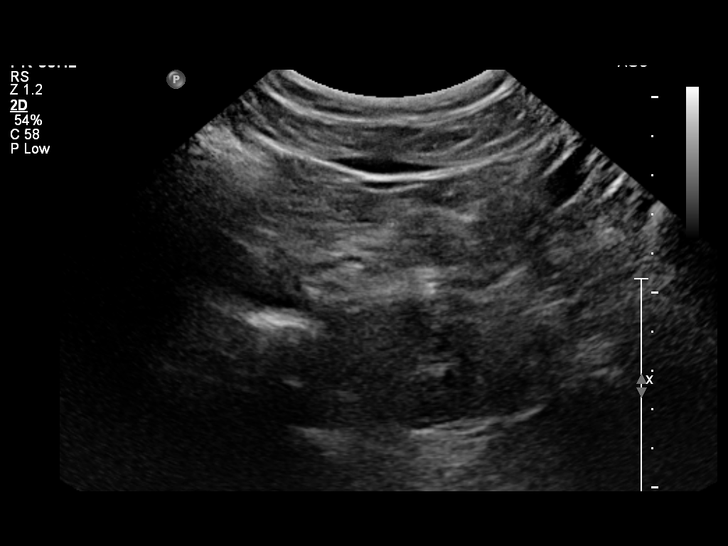
[im 15/44]
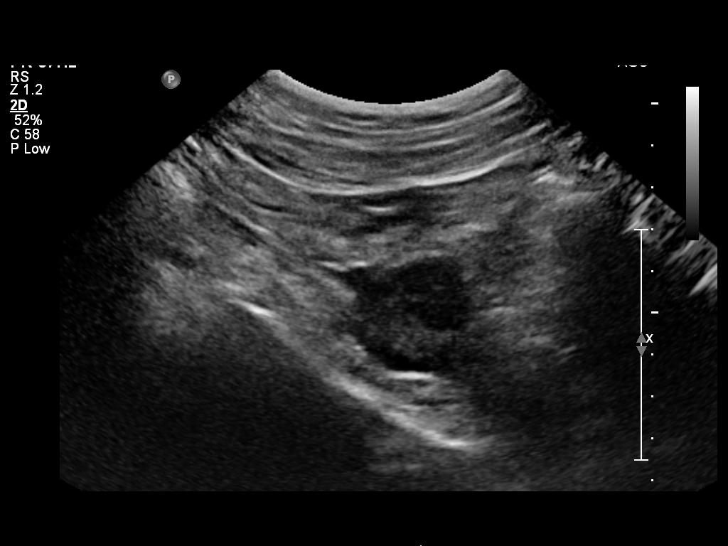
[im 18/44]
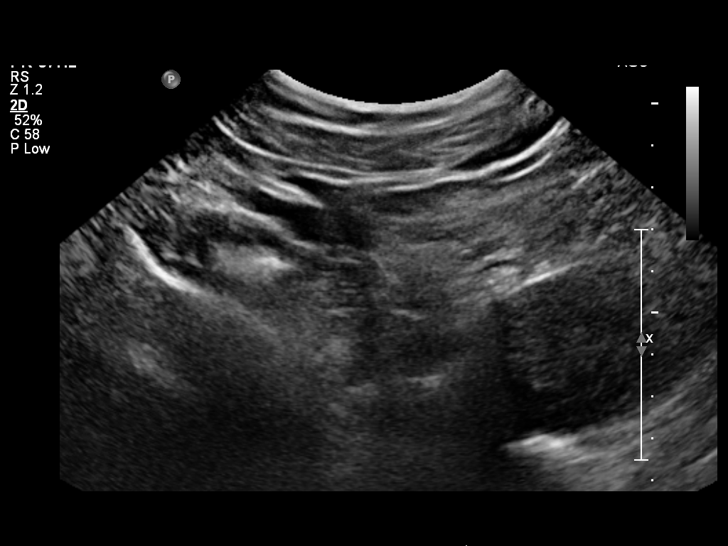
[im 21/44]
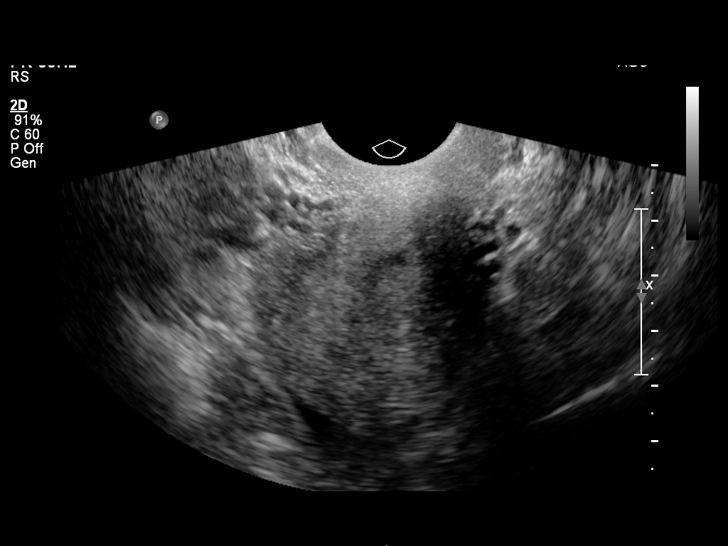
[im 24/44]
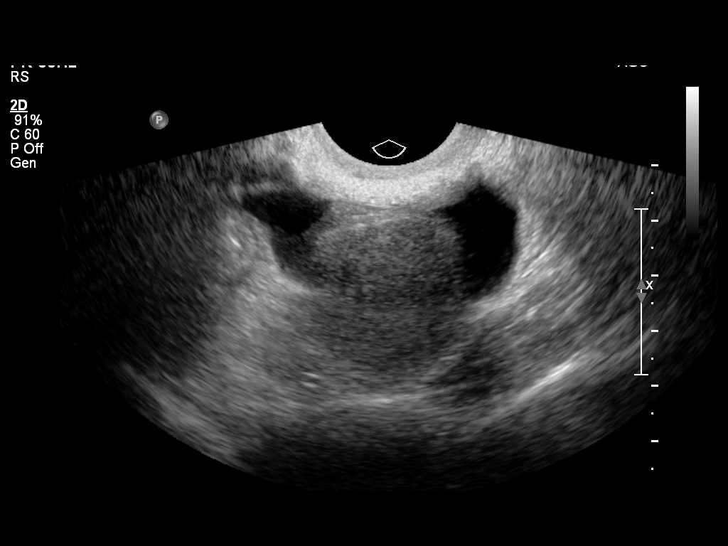
[im 28/44]
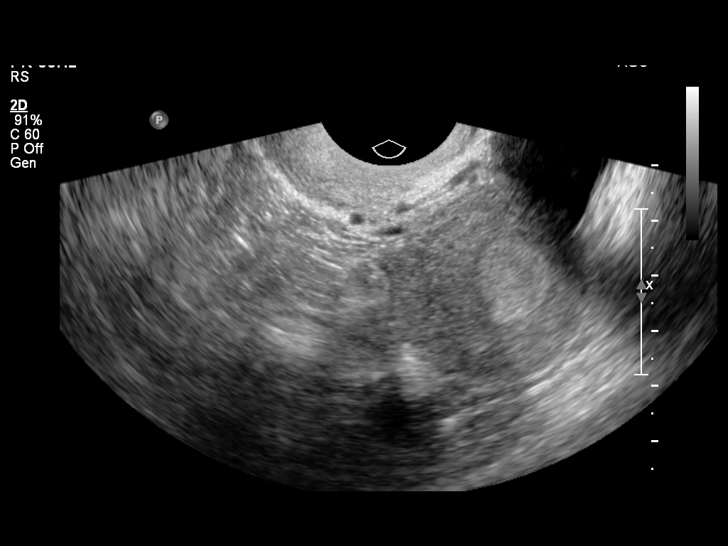
[im 31/44]
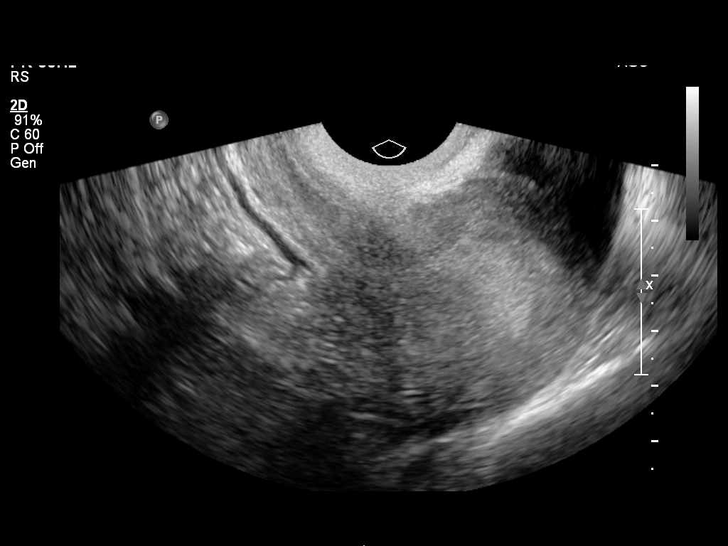
[im 34/44]
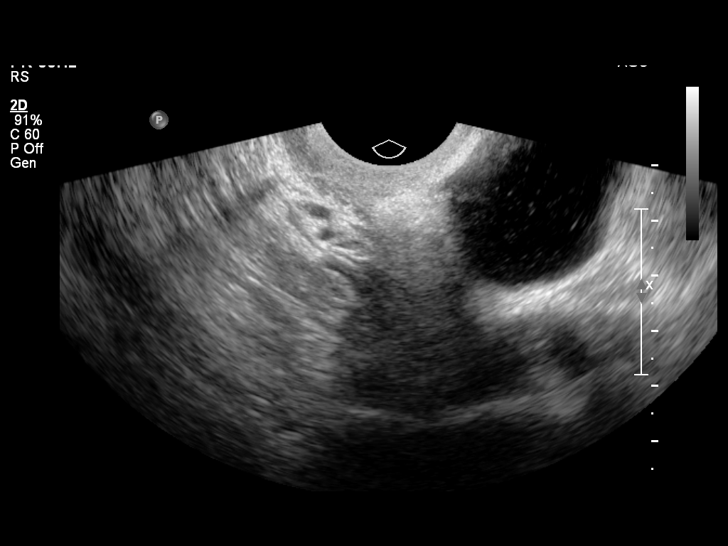
[im 37/44]
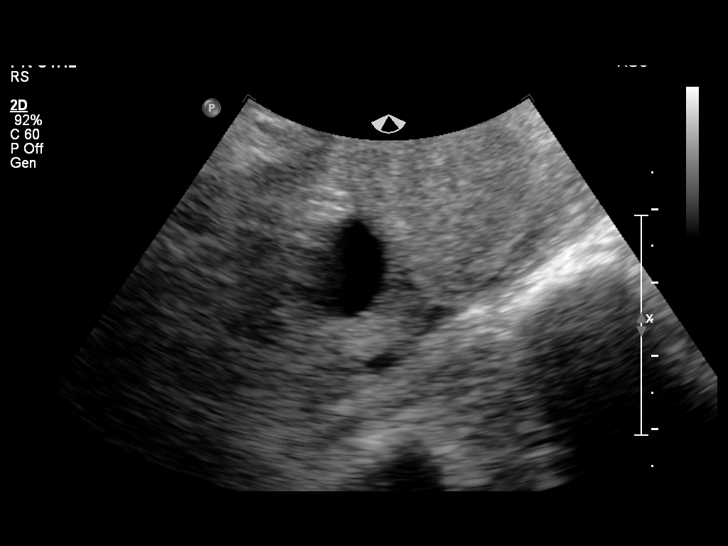
[im 40/44]
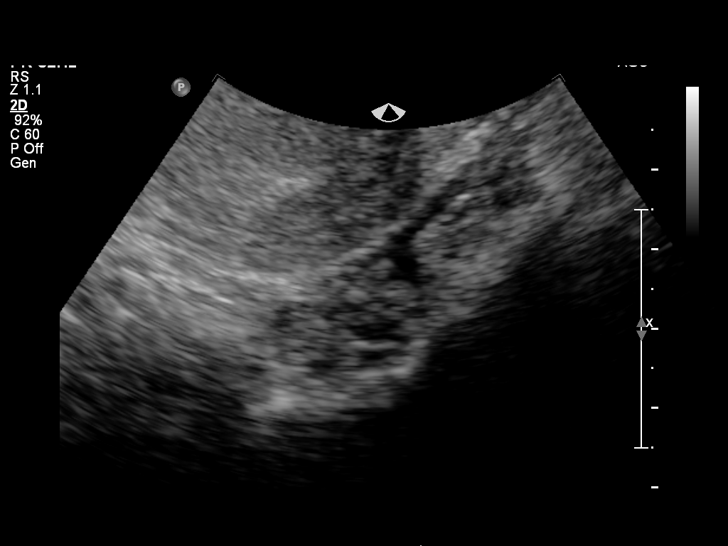
[im 44/44]
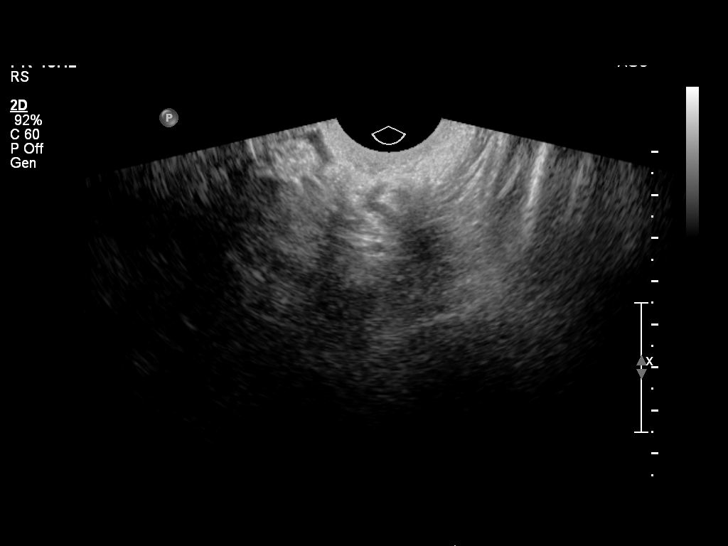

[14 of 28 positions shown; findings below may reference images not displayed]

FINDINGS: No intra or extrauterine gestational sac is identified. No adnexal
mass. The ovaries are symmetric and normal in size. There is trace
simple appearing pelvic fluid.
IMPRESSION: 1. Pregnancy of unknown location. Differential considerations
include intrauterine gestation too early to be sonographically
visualized, spontaneous abortion, or ectopic pregnancy. Consider
follow-up ultrasound in 10 days and serial quantitative beta HCG
follow-up.
2. Trace, simple pelvic fluid.

## 2022-04-07 ENCOUNTER — Ambulatory Visit: Admit: 2022-04-07 | Payer: 59

## 2022-04-08 ENCOUNTER — Ambulatory Visit
Admission: RE | Admit: 2022-04-08 | Discharge: 2022-04-08 | Disposition: A | Discharge status: Home / Self Care | Payer: 59 | Source: Ambulatory Visit

## 2022-04-08 VITALS — BP 120/83 | HR 74 | Temp 98.1°F | Resp 18

## 2022-04-08 DIAGNOSIS — H9203 Otalgia, bilateral: Secondary | ICD-10-CM | POA: Diagnosis not present

## 2022-04-08 DIAGNOSIS — Z8709 Personal history of other diseases of the respiratory system: Secondary | ICD-10-CM | POA: Diagnosis not present

## 2022-04-08 DIAGNOSIS — H6992 Unspecified Eustachian tube disorder, left ear: Secondary | ICD-10-CM

## 2022-04-08 DIAGNOSIS — J309 Allergic rhinitis, unspecified: Secondary | ICD-10-CM

## 2022-04-08 MED ORDER — CEFDINIR 300 MG PO CAPS: 300.0000 mg | ORAL_CAPSULE | Freq: Two times a day (BID) | ORAL | 0 refills | Status: DC

## 2022-04-08 MED ORDER — PREDNISONE 20 MG PO TABS: ORAL_TABLET | ORAL | 0 refills | Status: DC

## 2022-04-08 MED ORDER — FLUCONAZOLE 150 MG PO TABS: 150.0000 mg | ORAL_TABLET | ORAL | 0 refills | Status: DC

## 2022-04-08 NOTE — ED Provider Notes (Signed)
[facility]  Note:  This document was prepared using Systems analyst and may include unintentional dictation errors.  MRN: [id number] DOB: [birth year]  Subjective:   Mary Flowers is a 37 y.o. female presenting for 3-4 day history of acute onset recurrent bilateral ear pain worse on the left. Has had ear fullness as well. Has undergone multiple treatments for sinusitis since November 2023. Has taken levofloxacin from 03/15/2022, doxycycline 03/01/2022, Augmentin 02/19/2022 and has undergone 2 rounds of prednisone from the last 2 visits. She is awaiting to get in with an ENT specialist.  Has been using Flonase, multiple over-the-counter allergy medications.  No current facility-administered medications for this encounter.  Current Outpatient Medications:    FLUoxetine (PROZAC) 40 MG capsule, Take 40 mg by mouth daily., Disp: , Rfl:    Melatonin 1 MG CAPS, Take by mouth., Disp: , Rfl:    traZODone (DESYREL) 100 MG tablet, Take 25 mg by mouth at bedtime., Disp: , Rfl:    calcium carbonate (TUMS - DOSED IN MG ELEMENTAL CALCIUM) 500 MG chewable tablet, Chew 2 tablets by mouth 2 (two) times daily as needed for indigestion or heartburn., Disp: , Rfl:    diphenhydrAMINE (SOMINEX) 25 MG tablet, Take 50-100 mg by mouth at bedtime as needed for sleep., Disp: , Rfl:    No Known Allergies  Past Medical History:  Diagnosis Date   Anxiety    Anxiety disorder    Biliary dyskinesia    Complication of anesthesia    GERD (gastroesophageal reflux disease)    Lymph edema LLE   OCD (obsessive compulsive disorder)    PMDD (premenstrual dysphoric disorder)    PONV (postoperative nausea and vomiting)      Past Surgical History:  Procedure Laterality Date   CHOLECYSTECTOMY  06/13/2013   Procedure: LAPAROSCOPIC CHOLECYSTECTOMY;  Surgeon: Stark Klein, MD;  Location: [facility] OR;  Service: General;;   WISDOM TOOTH EXTRACTION      Family History  Problem Relation  Age of Onset   Cancer Maternal Grandmother    Diabetes Maternal Grandfather    Heart disease Paternal Grandfather    Stroke Paternal Grandfather     Social History   Tobacco Use   Smoking status: Former    Packs/day: 0.40    Types: Cigarettes   Smokeless tobacco: Never  Substance Use Topics   Alcohol use: Yes    Comment: 10 drinks per week   Drug use: No    ROS   Objective:   Vitals: BP 120/83 (BP Location: Right Arm)   Pulse 74   Temp 98.1 F (36.7 C) (Oral)   Resp 18   SpO2 96%   Physical Exam Constitutional:      General: She is not in acute distress.    Appearance: Normal appearance. She is well-developed and normal weight. She is not ill-appearing, toxic-appearing or diaphoretic.  HENT:     Head: Normocephalic and atraumatic.     Right Ear: Ear canal and external ear normal. Tenderness present. No drainage. A middle ear effusion is present. There is no impacted cerumen. Tympanic membrane is not injected, perforated, erythematous or bulging.     Left Ear: Tympanic membrane, ear canal and external ear normal. No drainage or tenderness.  No middle ear effusion. There is no impacted cerumen. Tympanic membrane is not injected, perforated, erythematous or bulging.     Nose: Nose normal. No congestion or rhinorrhea.     Mouth/Throat:     Mouth: Mucous  membranes are moist. No oral lesions.     Pharynx: No pharyngeal swelling, oropharyngeal exudate, posterior oropharyngeal erythema or uvula swelling.     Tonsils: No tonsillar exudate or tonsillar abscesses.  Eyes:     General: No scleral icterus.       Right eye: No discharge.        Left eye: No discharge.     Extraocular Movements: Extraocular movements intact.     Right eye: Normal extraocular motion.     Left eye: Normal extraocular motion.     Conjunctiva/sclera: Conjunctivae normal.  Cardiovascular:     Rate and Rhythm: Normal rate.  Pulmonary:     Effort: Pulmonary effort is normal.  Musculoskeletal:      Cervical back: Normal range of motion and neck supple.  Lymphadenopathy:     Cervical: No cervical adenopathy.  Skin:    General: Skin is warm and dry.  Neurological:     General: No focal deficit present.     Mental Status: She is alert and oriented to person, place, and time.  Psychiatric:        Mood and Affect: Mood normal.        Behavior: Behavior normal.     Assessment and Plan :   PDMP not reviewed this encounter.  1. Eustachian tube dysfunction, left   2. Allergic rhinitis, unspecified seasonality, unspecified trigger   3. Acute ear pain, bilateral   4. History of sinusitis     Discussed the risks of multiple rounds of antibiotics and steroids.  Patient verbalized understanding.  Would like to proceed with 1 more round of antibiotics.  I was agreeable but emphasized need to seek a consultation from an ENT specialist.  She would also like to do 1 more round of prednisone.  He is on fluconazole for secondary yeast vaginitis. Counseled patient on potential for adverse effects with medications prescribed/recommended today, ER and return-to-clinic precautions discussed, patient verbalized understanding.    Jaynee Eagles, [place] 04/08/22 [phone]

## 2022-10-12 ENCOUNTER — Other Ambulatory Visit: Payer: Self-pay | Admitting: Oncology

## 2022-10-12 DIAGNOSIS — Z006 Encounter for examination for normal comparison and control in clinical research program: Secondary | ICD-10-CM

## 2023-01-12 ENCOUNTER — Ambulatory Visit: Payer: Managed Care, Other (non HMO) | Admitting: Internal Medicine

## 2023-01-12 VITALS — BP 130/70 | HR 81 | Temp 98.3°F | Ht 68.11 in | Wt 194.0 lb

## 2023-01-12 DIAGNOSIS — G4700 Insomnia, unspecified: Secondary | ICD-10-CM

## 2023-01-12 DIAGNOSIS — F419 Anxiety disorder, unspecified: Secondary | ICD-10-CM

## 2023-01-12 DIAGNOSIS — Z23 Encounter for immunization: Secondary | ICD-10-CM

## 2023-01-12 DIAGNOSIS — Z124 Encounter for screening for malignant neoplasm of cervix: Secondary | ICD-10-CM

## 2023-01-12 DIAGNOSIS — K581 Irritable bowel syndrome with constipation: Secondary | ICD-10-CM

## 2023-01-12 DIAGNOSIS — F429 Obsessive-compulsive disorder, unspecified: Secondary | ICD-10-CM | POA: Diagnosis not present

## 2023-01-12 DIAGNOSIS — I890 Lymphedema, not elsewhere classified: Secondary | ICD-10-CM

## 2023-01-12 MED ORDER — FLUOXETINE HCL 40 MG PO CAPS
40.0000 mg | ORAL_CAPSULE | Freq: Every day | ORAL | 1 refills | Status: AC
Start: 2023-01-12 — End: ?

## 2023-01-12 MED ORDER — TRAZODONE HCL 100 MG PO TABS
50.0000 mg | ORAL_TABLET | Freq: Every day | ORAL | 1 refills | Status: AC
Start: 2023-01-12 — End: ?

## 2023-01-12 NOTE — Addendum Note (Signed)
Addended by: Christy Sartorius on: 01/12/2023 04:27 PM   Modules accepted: Orders

## 2023-01-12 NOTE — Progress Notes (Signed)
New Patient Office Visit     CC/Reason for Visit: Establish care, discuss acute and chronic concerns Previous PCP: Ellamae Sia Last Visit: Unknown  HPI: Mary Flowers is a 37 y.o. female who is coming in today for the above mentioned reasons. Past Medical History is significant for: OCD and anxiety managed on fluoxetine and trazodone.  Left lower extremity lymphedema, and insomnia.  She is requesting prescription for compression stockings and pneumatic pump for her leg.  She is requesting referral to GYN.  She also has a history of IBS constipated type and is requesting referral to GI as she has been told in the past she needs a colonoscopy.  She is due for Tdap and COVID vaccinations.  She recently completed her flu vaccine.  She has been having difficulty focusing at work and wonders if she may have ADHD.   Past Medical/Surgical History: Past Medical History:  Diagnosis Date   Anxiety    Anxiety disorder    Biliary dyskinesia    Complication of anesthesia    GERD (gastroesophageal reflux disease)    Lymph edema LLE   OCD (obsessive compulsive disorder)    PMDD (premenstrual dysphoric disorder)    PONV (postoperative nausea and vomiting)     Past Surgical History:  Procedure Laterality Date   CHOLECYSTECTOMY  06/13/2013   Procedure: LAPAROSCOPIC CHOLECYSTECTOMY;  Surgeon: Almond Lint, MD;  Location: [facility] OR;  Service: General;;   WISDOM TOOTH EXTRACTION      Social History:  reports that she has quit smoking. Her smoking use included cigarettes. She has never used smokeless tobacco. She reports current alcohol use. She reports that she does not use drugs.  Allergies: No Known Allergies  Family History:  Family History  Problem Relation Age of Onset   Cancer Maternal Grandmother    Diabetes Maternal Grandfather    Heart disease Paternal Grandfather    Stroke Paternal Grandfather      Current Outpatient Medications:    Melatonin 1 MG CAPS, Take by mouth.,  Disp: , Rfl:    FLUoxetine (PROZAC) 40 MG capsule, Take 1 capsule (40 mg total) by mouth daily., Disp: 90 capsule, Rfl: 1   traZODone (DESYREL) 100 MG tablet, Take 0.5 tablets (50 mg total) by mouth at bedtime., Disp: 90 tablet, Rfl: 1  Review of Systems:  Negative except as indicated in HPI.   Physical Exam: Vitals:   01/12/23 1543  BP: 130/70  Pulse: 81  Temp: 98.3 F (36.8 C)  TempSrc: Oral  SpO2: 98%  Weight: 194 lb (88 kg)  Height: 5' 8.11" (1.73 m)   Body mass index is 29.4 kg/m.  Physical Exam Vitals reviewed.  Constitutional:      Appearance: Normal appearance.  HENT:     Head: Normocephalic and atraumatic.  Eyes:     Conjunctiva/sclera: Conjunctivae normal.     Pupils: Pupils are equal, round, and reactive to light.  Cardiovascular:     Rate and Rhythm: Normal rate and regular rhythm.  Pulmonary:     Effort: Pulmonary effort is normal.     Breath sounds: Normal breath sounds.  Skin:    General: Skin is warm and dry.  Neurological:     General: No focal deficit present.     Mental Status: She is alert and oriented to person, place, and time.  Psychiatric:        Mood and Affect: Mood normal.        Behavior: Behavior normal.  Thought Content: Thought content normal.        Judgment: Judgment normal.       Impression and Plan:  Lymph edema  Obsessive-compulsive disorder, unspecified type -     FLUoxetine HCl; Take 1 capsule (40 mg total) by mouth daily.  Dispense: 90 capsule; Refill: 1 -     traZODone HCl; Take 0.5 tablets (50 mg total) by mouth at bedtime.  Dispense: 90 tablet; Refill: 1  Insomnia, unspecified type  Anxiety  Immunization due  Screening for cervical cancer -     Ambulatory referral to Gynecology  Irritable bowel syndrome with constipation -     Ambulatory referral to Gastroenterology    -Tdap administered in office today. -Fluoxetine and trazodone have been refilled. -Prescription given for compression socks and  pneumatic pump for her lymphedema. -Referrals placed to GI and to gynecology. -Information given on scheduling CBT and [place] attention specialist clinic referral.        Chaya Jan, MD [facility] at [facility]

## 2023-01-18 ENCOUNTER — Ambulatory Visit: Payer: Self-pay | Admitting: Internal Medicine

## 2023-02-24 ENCOUNTER — Encounter: Payer: Self-pay | Admitting: Gastroenterology

## 2023-04-11 DIAGNOSIS — F900 Attention-deficit hyperactivity disorder, predominantly inattentive type: Secondary | ICD-10-CM | POA: Diagnosis not present

## 2023-04-11 DIAGNOSIS — F3289 Other specified depressive episodes: Secondary | ICD-10-CM | POA: Diagnosis not present

## 2023-04-12 ENCOUNTER — Encounter: Payer: Commercial Managed Care - PPO | Admitting: Internal Medicine

## 2023-04-18 DIAGNOSIS — F900 Attention-deficit hyperactivity disorder, predominantly inattentive type: Secondary | ICD-10-CM | POA: Diagnosis not present

## 2023-04-18 DIAGNOSIS — F418 Other specified anxiety disorders: Secondary | ICD-10-CM | POA: Diagnosis not present

## 2023-04-18 DIAGNOSIS — F3289 Other specified depressive episodes: Secondary | ICD-10-CM | POA: Diagnosis not present

## 2023-04-20 DIAGNOSIS — R051 Acute cough: Secondary | ICD-10-CM | POA: Diagnosis not present

## 2023-04-20 DIAGNOSIS — Z20822 Contact with and (suspected) exposure to covid-19: Secondary | ICD-10-CM | POA: Diagnosis not present

## 2023-04-20 DIAGNOSIS — R509 Fever, unspecified: Secondary | ICD-10-CM | POA: Diagnosis not present

## 2023-04-20 DIAGNOSIS — J069 Acute upper respiratory infection, unspecified: Secondary | ICD-10-CM | POA: Diagnosis not present

## 2023-04-20 DIAGNOSIS — R0981 Nasal congestion: Secondary | ICD-10-CM | POA: Diagnosis not present

## 2023-04-23 DIAGNOSIS — J019 Acute sinusitis, unspecified: Secondary | ICD-10-CM | POA: Diagnosis not present

## 2023-04-23 DIAGNOSIS — R051 Acute cough: Secondary | ICD-10-CM | POA: Diagnosis not present

## 2023-04-25 DIAGNOSIS — E663 Overweight: Secondary | ICD-10-CM | POA: Diagnosis not present

## 2023-04-25 DIAGNOSIS — F900 Attention-deficit hyperactivity disorder, predominantly inattentive type: Secondary | ICD-10-CM | POA: Diagnosis not present

## 2023-04-25 DIAGNOSIS — F3289 Other specified depressive episodes: Secondary | ICD-10-CM | POA: Diagnosis not present

## 2023-04-25 DIAGNOSIS — F418 Other specified anxiety disorders: Secondary | ICD-10-CM | POA: Diagnosis not present

## 2023-04-25 DIAGNOSIS — Z713 Dietary counseling and surveillance: Secondary | ICD-10-CM | POA: Diagnosis not present

## 2023-05-02 DIAGNOSIS — F3289 Other specified depressive episodes: Secondary | ICD-10-CM | POA: Diagnosis not present

## 2023-05-02 DIAGNOSIS — F418 Other specified anxiety disorders: Secondary | ICD-10-CM | POA: Diagnosis not present

## 2023-05-02 DIAGNOSIS — F900 Attention-deficit hyperactivity disorder, predominantly inattentive type: Secondary | ICD-10-CM | POA: Diagnosis not present

## 2023-05-09 DIAGNOSIS — F900 Attention-deficit hyperactivity disorder, predominantly inattentive type: Secondary | ICD-10-CM | POA: Diagnosis not present

## 2023-05-09 DIAGNOSIS — Z713 Dietary counseling and surveillance: Secondary | ICD-10-CM | POA: Diagnosis not present

## 2023-05-09 DIAGNOSIS — F418 Other specified anxiety disorders: Secondary | ICD-10-CM | POA: Diagnosis not present

## 2023-05-09 DIAGNOSIS — E663 Overweight: Secondary | ICD-10-CM | POA: Diagnosis not present

## 2023-05-09 DIAGNOSIS — F3289 Other specified depressive episodes: Secondary | ICD-10-CM | POA: Diagnosis not present

## 2023-05-16 DIAGNOSIS — F418 Other specified anxiety disorders: Secondary | ICD-10-CM | POA: Diagnosis not present

## 2023-05-16 DIAGNOSIS — F3289 Other specified depressive episodes: Secondary | ICD-10-CM | POA: Diagnosis not present

## 2023-05-16 DIAGNOSIS — F900 Attention-deficit hyperactivity disorder, predominantly inattentive type: Secondary | ICD-10-CM | POA: Diagnosis not present

## 2023-05-16 DIAGNOSIS — F1010 Alcohol abuse, uncomplicated: Secondary | ICD-10-CM | POA: Diagnosis not present

## 2023-05-17 ENCOUNTER — Ambulatory Visit: Payer: Commercial Managed Care - PPO | Admitting: Internal Medicine

## 2023-05-17 ENCOUNTER — Telehealth: Payer: Self-pay

## 2023-05-17 ENCOUNTER — Encounter: Payer: Self-pay | Admitting: Internal Medicine

## 2023-05-17 VITALS — BP 120/80 | HR 85 | Temp 98.2°F | Ht 68.0 in | Wt 197.3 lb

## 2023-05-17 DIAGNOSIS — F419 Anxiety disorder, unspecified: Secondary | ICD-10-CM | POA: Diagnosis not present

## 2023-05-17 DIAGNOSIS — Z0000 Encounter for general adult medical examination without abnormal findings: Secondary | ICD-10-CM | POA: Diagnosis not present

## 2023-05-17 DIAGNOSIS — Z1159 Encounter for screening for other viral diseases: Secondary | ICD-10-CM | POA: Diagnosis not present

## 2023-05-17 DIAGNOSIS — Z114 Encounter for screening for human immunodeficiency virus [HIV]: Secondary | ICD-10-CM

## 2023-05-17 DIAGNOSIS — F429 Obsessive-compulsive disorder, unspecified: Secondary | ICD-10-CM | POA: Diagnosis not present

## 2023-05-17 DIAGNOSIS — I890 Lymphedema, not elsewhere classified: Secondary | ICD-10-CM

## 2023-05-17 DIAGNOSIS — Z833 Family history of diabetes mellitus: Secondary | ICD-10-CM

## 2023-05-17 LAB — COMPREHENSIVE METABOLIC PANEL
ALT: 17 U/L (ref 0–35)
AST: 18 U/L (ref 0–37)
Albumin: 4.6 g/dL (ref 3.5–5.2)
Alkaline Phosphatase: 66 U/L (ref 39–117)
BUN: 16 mg/dL (ref 6–23)
CO2: 27 meq/L (ref 19–32)
Calcium: 9.5 mg/dL (ref 8.4–10.5)
Chloride: 99 meq/L (ref 96–112)
Creatinine, Ser: 0.83 mg/dL (ref 0.40–1.20)
GFR: 88.56 mL/min (ref >60.00)
Glucose, Bld: 119 mg/dL — ABNORMAL HIGH (ref 70–99)
Potassium: 4 meq/L (ref 3.5–5.1)
Sodium: 136 meq/L (ref 135–145)
Total Bilirubin: 0.4 mg/dL (ref 0.2–1.2)
Total Protein: 7.3 g/dL (ref 6.0–8.3)

## 2023-05-17 LAB — TSH: TSH: 1.53 u[IU]/mL (ref 0.35–5.50)

## 2023-05-17 LAB — CBC WITH DIFFERENTIAL/PLATELET
Basophils Absolute: 0 10*3/uL (ref 0.0–0.1)
Basophils Relative: 0.7 % (ref 0.0–3.0)
Eosinophils Absolute: 0.2 10*3/uL (ref 0.0–0.7)
Eosinophils Relative: 2.6 % (ref 0.0–5.0)
HCT: 41.7 % (ref 36.0–46.0)
Hemoglobin: 13.9 g/dL (ref 12.0–15.0)
Lymphocytes Relative: 25.5 % (ref 12.0–46.0)
Lymphs Abs: 1.6 10*3/uL (ref 0.7–4.0)
MCHC: 33.3 g/dL (ref 30.0–36.0)
MCV: 89.1 fL (ref 78.0–100.0)
Monocytes Absolute: 0.3 10*3/uL (ref 0.1–1.0)
Monocytes Relative: 5 % (ref 3.0–12.0)
Neutro Abs: 4 10*3/uL (ref 1.4–7.7)
Neutrophils Relative %: 66.2 % (ref 43.0–77.0)
Platelets: 308 10*3/uL (ref 150.0–400.0)
RBC: 4.68 Mil/uL (ref 3.87–5.11)
RDW: 12.7 % (ref 11.5–15.5)
WBC: 6.1 10*3/uL (ref 4.0–10.5)

## 2023-05-17 LAB — VITAMIN B12: Vitamin B-12: 347 pg/mL (ref 211–911)

## 2023-05-17 LAB — LIPID PANEL
Cholesterol: 228 mg/dL — ABNORMAL HIGH (ref 0–200)
HDL: 50.9 mg/dL (ref >39.00)
LDL Cholesterol: 123 mg/dL — ABNORMAL HIGH (ref 0–99)
NonHDL: 176.83
Total CHOL/HDL Ratio: 4
Triglycerides: 269 mg/dL — ABNORMAL HIGH (ref 0.0–149.0)
VLDL: 53.8 mg/dL — ABNORMAL HIGH (ref 0.0–40.0)

## 2023-05-17 LAB — VITAMIN D 25 HYDROXY (VIT D DEFICIENCY, FRACTURES): VITD: 18.58 ng/mL — ABNORMAL LOW (ref 30.00–100.00)

## 2023-05-17 NOTE — Telephone Encounter (Signed)
Copied from CRM [id number]. Topic: Clinical - Request for Lab/Test Order >> May 17, 2023  2:43 PM Tiffany H wrote: Reason for CRM: Patient called to request A1C lab due to family history of diabetes and personal history of pre-diabetes. Please assist.   Patient would like to know if A1C can be tested from existing specimens. Please assist.

## 2023-05-17 NOTE — Progress Notes (Signed)
Established Patient Office Visit     CC/Reason for Visit: Annual preventive exam  HPI: Mary Flowers is a 38 y.o. female who is coming in today for the above mentioned reasons. Past Medical History is significant for: OCD, generalized anxiety disorder, insomnia followed by psychiatry.  Chronic left leg lymphedema.  Has been feeling well without major concerns or complaints.  Has routine eye care, overdue for dental exam.   Past Medical/Surgical History: Past Medical History:  Diagnosis Date   Anxiety    Anxiety disorder    Biliary dyskinesia    Complication of anesthesia    GERD (gastroesophageal reflux disease)    Lymph edema LLE   OCD (obsessive compulsive disorder)    PMDD (premenstrual dysphoric disorder)    PONV (postoperative nausea and vomiting)     Past Surgical History:  Procedure Laterality Date   CHOLECYSTECTOMY  06/13/2013   Procedure: LAPAROSCOPIC CHOLECYSTECTOMY;  Surgeon: Almond Lint, MD;  Location: [facility] OR;  Service: General;;   WISDOM TOOTH EXTRACTION      Social History:  reports that she has quit smoking. Her smoking use included cigarettes. She has never used smokeless tobacco. She reports current alcohol use. She reports that she does not use drugs.  Allergies: No Known Allergies  Family History:  Family History  Problem Relation Age of Onset   Cancer Maternal Grandmother    Diabetes Maternal Grandfather    Heart disease Paternal Grandfather    Stroke Paternal Grandfather      Current Outpatient Medications:    buPROPion (WELLBUTRIN XL) 300 MG 24 hr tablet, Take 300 mg by mouth daily., Disp: , Rfl:    FLUoxetine (PROZAC) 40 MG capsule, Take 1 capsule (40 mg total) by mouth daily., Disp: 90 capsule, Rfl: 1   Melatonin 1 MG CAPS, Take by mouth., Disp: , Rfl:    traZODone (DESYREL) 100 MG tablet, Take 0.5 tablets (50 mg total) by mouth at bedtime., Disp: 90 tablet, Rfl: 1  Review of Systems:  Negative unless indicated in  HPI.   Physical Exam: Vitals:   05/17/23 1358  BP: 120/80  Pulse: 85  Temp: 98.2 F (36.8 C)  TempSrc: Oral  SpO2: 98%  Weight: 197 lb 4.8 oz (89.5 kg)  Height: 5\' 8"  (1.727 m)    Body mass index is 30 kg/m.   Physical Exam Vitals reviewed.  Constitutional:      General: She is not in acute distress.    Appearance: Normal appearance. She is not ill-appearing, toxic-appearing or diaphoretic.  HENT:     Head: Normocephalic.     Right Ear: Tympanic membrane, ear canal and external ear normal. There is no impacted cerumen.     Left Ear: Tympanic membrane, ear canal and external ear normal. There is no impacted cerumen.     Nose: Nose normal.     Mouth/Throat:     Mouth: Mucous membranes are moist.     Pharynx: Oropharynx is clear. No oropharyngeal exudate or posterior oropharyngeal erythema.  Eyes:     General: No scleral icterus.       Right eye: No discharge.        Left eye: No discharge.     Conjunctiva/sclera: Conjunctivae normal.     Pupils: Pupils are equal, round, and reactive to light.  Neck:     Vascular: No carotid bruit.  Cardiovascular:     Rate and Rhythm: Normal rate and regular rhythm.     Pulses: Normal pulses.  Heart sounds: Normal heart sounds.  Pulmonary:     Effort: Pulmonary effort is normal. No respiratory distress.     Breath sounds: Normal breath sounds.  Abdominal:     General: Abdomen is flat. Bowel sounds are normal.     Palpations: Abdomen is soft.  Musculoskeletal:        General: Normal range of motion.     Cervical back: Normal range of motion.  Skin:    General: Skin is warm and dry.  Neurological:     General: No focal deficit present.     Mental Status: She is alert and oriented to person, place, and time. Mental status is at baseline.  Psychiatric:        Mood and Affect: Mood normal.        Behavior: Behavior normal.        Thought Content: Thought content normal.        Judgment: Judgment normal.       Impression  and Plan:  Encounter for preventive health examination  Obsessive-compulsive disorder, unspecified type  Anxiety  Lymph edema -     CBC with Differential/Platelet; Future -     Comprehensive metabolic panel; Future -     Lipid panel; Future -     TSH; Future -     Vitamin B12; Future -     VITAMIN D 25 Hydroxy (Vit-D Deficiency, Fractures); Future  Encounter for hepatitis C screening test for low risk patient -     Hepatitis C antibody; Future  Encounter for screening for HIV -     HIV Antibody (routine testing w rflx); Future   -Recommend routine eye and dental care. -Healthy lifestyle discussed in detail. -Labs to be updated today. -Prostate cancer screening: N/A Health Maintenance  Topic Date Due   HIV Screening  Never done   Hepatitis C Screening  Never done   Pap with HPV screening  08/20/2017   COVID-19 Vaccine (3 - 2024-25 season) 12/04/2022   DTaP/Tdap/Td vaccine (3 - Td or Tdap) 01/11/2033   Flu Shot  Completed   HPV Vaccine  Aged Out     -Advised to update COVID vaccination. -Advised to update dental exam. -GYN and GI appointments are scheduled.    Chaya Jan, MD [facility] at [facility]

## 2023-05-17 NOTE — Addendum Note (Signed)
Addended by: Kern Reap B on: 05/17/2023 04:37 PM   Modules accepted: Orders

## 2023-05-17 NOTE — Telephone Encounter (Signed)
Add on sheet faxed and confirmed

## 2023-05-18 ENCOUNTER — Ambulatory Visit (INDEPENDENT_AMBULATORY_CARE_PROVIDER_SITE_OTHER): Payer: Commercial Managed Care - PPO

## 2023-05-18 DIAGNOSIS — Z833 Family history of diabetes mellitus: Secondary | ICD-10-CM | POA: Diagnosis not present

## 2023-05-18 LAB — HIV ANTIBODY (ROUTINE TESTING W REFLEX): HIV 1&2 Ab, 4th Generation: NONREACTIVE

## 2023-05-18 LAB — HEMOGLOBIN A1C: Hgb A1c MFr Bld: 5.5 % (ref 4.6–6.5)

## 2023-05-18 LAB — HEPATITIS C ANTIBODY: Hepatitis C Ab: NONREACTIVE

## 2023-05-23 DIAGNOSIS — F1010 Alcohol abuse, uncomplicated: Secondary | ICD-10-CM | POA: Diagnosis not present

## 2023-05-23 DIAGNOSIS — F900 Attention-deficit hyperactivity disorder, predominantly inattentive type: Secondary | ICD-10-CM | POA: Diagnosis not present

## 2023-05-23 DIAGNOSIS — Z713 Dietary counseling and surveillance: Secondary | ICD-10-CM | POA: Diagnosis not present

## 2023-05-23 DIAGNOSIS — E663 Overweight: Secondary | ICD-10-CM | POA: Diagnosis not present

## 2023-05-23 DIAGNOSIS — F418 Other specified anxiety disorders: Secondary | ICD-10-CM | POA: Diagnosis not present

## 2023-05-23 DIAGNOSIS — F3289 Other specified depressive episodes: Secondary | ICD-10-CM | POA: Diagnosis not present

## 2023-05-24 ENCOUNTER — Ambulatory Visit: Payer: Managed Care, Other (non HMO) | Admitting: Gastroenterology

## 2023-05-24 NOTE — Progress Notes (Deleted)
 HPI :     EGD Jan 2015 (Dr. Elnoria Howard) Normal EGD  Past Medical History:  Diagnosis Date   Anxiety    Anxiety disorder    Biliary dyskinesia    Complication of anesthesia    GERD (gastroesophageal reflux disease)    Lymph edema LLE   OCD (obsessive compulsive disorder)    PMDD (premenstrual dysphoric disorder)    PONV (postoperative nausea and vomiting)      Past Surgical History:  Procedure Laterality Date   CHOLECYSTECTOMY  06/13/2013   Procedure: LAPAROSCOPIC CHOLECYSTECTOMY;  Surgeon: Almond Lint, MD;  Location: [facility] OR;  Service: General;;   WISDOM TOOTH EXTRACTION     Family History  Problem Relation Age of Onset   Cancer Maternal Grandmother    Diabetes Maternal Grandfather    Heart disease Paternal Grandfather    Stroke Paternal Grandfather    Social History   Tobacco Use   Smoking status: Former    Current packs/day: 0.40    Types: Cigarettes   Smokeless tobacco: Never  Substance Use Topics   Alcohol use: Yes    Comment: 10 drinks per week   Drug use: No   Current Outpatient Medications  Medication Sig Dispense Refill   buPROPion (WELLBUTRIN XL) 300 MG 24 hr tablet Take 300 mg by mouth daily.     FLUoxetine (PROZAC) 40 MG capsule Take 1 capsule (40 mg total) by mouth daily. 90 capsule 1   Melatonin 1 MG CAPS Take by mouth.     traZODone (DESYREL) 100 MG tablet Take 0.5 tablets (50 mg total) by mouth at bedtime. 90 tablet 1   No current facility-administered medications for this visit.   No Known Allergies   Review of Systems: All systems reviewed and negative except where noted in HPI.    No results found.  Physical Exam: LMP 05/03/2023 (Approximate)  Constitutional: Pleasant,well-developed, ***female in no acute distress. HEENT: Normocephalic and atraumatic. Conjunctivae are normal. No scleral icterus. Neck supple.  Cardiovascular: Normal rate, regular rhythm.  Pulmonary/chest: Effort normal and breath sounds normal. No wheezing, rales or  rhonchi. Abdominal: Soft, nondistended, nontender. Bowel sounds active throughout. There are no masses palpable. No hepatomegaly. Extremities: no edema Lymphadenopathy: No cervical adenopathy noted. Neurological: Alert and oriented to person place and time. Skin: Skin is warm and dry. No rashes noted. Psychiatric: Normal mood and affect. Behavior is normal.  CBC    Component Value Date/Time   WBC 6.1 05/17/2023 1422   RBC 4.68 05/17/2023 1422   HGB 13.9 05/17/2023 1422   HCT 41.7 05/17/2023 1422   PLT 308.0 05/17/2023 1422   MCV 89.1 05/17/2023 1422   MCV 96.9 12/03/2012 1014   MCH 32.1 07/10/2014 2050   MCHC 33.3 05/17/2023 1422   RDW 12.7 05/17/2023 1422   LYMPHSABS 1.6 05/17/2023 1422   MONOABS 0.3 05/17/2023 1422   EOSABS 0.2 05/17/2023 1422   BASOSABS 0.0 05/17/2023 1422    CMP     Component Value Date/Time   NA 136 05/17/2023 1422   K 4.0 05/17/2023 1422   CL 99 05/17/2023 1422   CO2 27 05/17/2023 1422   GLUCOSE 119 (H) 05/17/2023 1422   BUN 16 05/17/2023 1422   CREATININE 0.83 05/17/2023 1422   CREATININE 0.75 12/03/2012 1011   CALCIUM 9.5 05/17/2023 1422   PROT 7.3 05/17/2023 1422   ALBUMIN 4.6 05/17/2023 1422   AST 18 05/17/2023 1422   ALT 17 05/17/2023 1422   ALKPHOS 66 05/17/2023 1422   BILITOT  0.4 05/17/2023 1422   GFRNONAA >90 06/10/2013 1549   GFRAA >90 06/10/2013 1549       Latest Ref Rng & Units 05/17/2023    2:22 PM 07/10/2014    8:50 PM 06/10/2013    3:49 PM  CBC EXTENDED  WBC 4.0 - 10.5 K/uL 6.1  9.2  5.4   RBC 3.87 - 5.11 Mil/uL 4.68  4.42  4.31   Hemoglobin 12.0 - 15.0 g/dL 16.1  09.6  04.5   HCT 36.0 - 46.0 % 41.7  40.2  38.4   Platelets 150.0 - 400.0 K/uL 308.0  285  268   NEUT# 1.4 - 7.7 K/uL 4.0   2.4   Lymph# 0.7 - 4.0 K/uL 1.6   2.3       ASSESSMENT AND PLAN:  Philip Aspen, Est[place]*

## 2023-05-25 ENCOUNTER — Encounter: Payer: Self-pay | Admitting: Internal Medicine

## 2023-05-25 ENCOUNTER — Other Ambulatory Visit: Payer: Self-pay | Admitting: Internal Medicine

## 2023-05-25 DIAGNOSIS — E559 Vitamin D deficiency, unspecified: Secondary | ICD-10-CM

## 2023-05-25 DIAGNOSIS — E785 Hyperlipidemia, unspecified: Secondary | ICD-10-CM | POA: Insufficient documentation

## 2023-05-25 MED ORDER — VITAMIN D (ERGOCALCIFEROL) 1.25 MG (50000 UNIT) PO CAPS
50000.0000 [IU] | ORAL_CAPSULE | ORAL | 0 refills | Status: AC
  Filled 2023-06-28: qty 8, 56d supply, fill #0

## 2023-05-30 DIAGNOSIS — F900 Attention-deficit hyperactivity disorder, predominantly inattentive type: Secondary | ICD-10-CM | POA: Diagnosis not present

## 2023-05-30 DIAGNOSIS — F418 Other specified anxiety disorders: Secondary | ICD-10-CM | POA: Diagnosis not present

## 2023-05-30 DIAGNOSIS — F1010 Alcohol abuse, uncomplicated: Secondary | ICD-10-CM | POA: Diagnosis not present

## 2023-05-30 DIAGNOSIS — F3289 Other specified depressive episodes: Secondary | ICD-10-CM | POA: Diagnosis not present

## 2023-05-31 DIAGNOSIS — F5105 Insomnia due to other mental disorder: Secondary | ICD-10-CM | POA: Diagnosis not present

## 2023-05-31 DIAGNOSIS — F331 Major depressive disorder, recurrent, moderate: Secondary | ICD-10-CM | POA: Diagnosis not present

## 2023-05-31 DIAGNOSIS — G4733 Obstructive sleep apnea (adult) (pediatric): Secondary | ICD-10-CM | POA: Diagnosis not present

## 2023-05-31 DIAGNOSIS — F429 Obsessive-compulsive disorder, unspecified: Secondary | ICD-10-CM | POA: Diagnosis not present

## 2023-06-06 DIAGNOSIS — F3289 Other specified depressive episodes: Secondary | ICD-10-CM | POA: Diagnosis not present

## 2023-06-06 DIAGNOSIS — F900 Attention-deficit hyperactivity disorder, predominantly inattentive type: Secondary | ICD-10-CM | POA: Diagnosis not present

## 2023-06-06 DIAGNOSIS — F1010 Alcohol abuse, uncomplicated: Secondary | ICD-10-CM | POA: Diagnosis not present

## 2023-06-06 DIAGNOSIS — E663 Overweight: Secondary | ICD-10-CM | POA: Diagnosis not present

## 2023-06-06 DIAGNOSIS — Z713 Dietary counseling and surveillance: Secondary | ICD-10-CM | POA: Diagnosis not present

## 2023-06-06 DIAGNOSIS — F418 Other specified anxiety disorders: Secondary | ICD-10-CM | POA: Diagnosis not present

## 2023-06-12 ENCOUNTER — Encounter: Payer: Commercial Managed Care - PPO | Admitting: Obstetrics and Gynecology

## 2023-06-13 DIAGNOSIS — F1010 Alcohol abuse, uncomplicated: Secondary | ICD-10-CM | POA: Diagnosis not present

## 2023-06-13 DIAGNOSIS — F900 Attention-deficit hyperactivity disorder, predominantly inattentive type: Secondary | ICD-10-CM | POA: Diagnosis not present

## 2023-06-13 DIAGNOSIS — F3289 Other specified depressive episodes: Secondary | ICD-10-CM | POA: Diagnosis not present

## 2023-06-13 DIAGNOSIS — F418 Other specified anxiety disorders: Secondary | ICD-10-CM | POA: Diagnosis not present

## 2023-06-17 ENCOUNTER — Other Ambulatory Visit (HOSPITAL_BASED_OUTPATIENT_CLINIC_OR_DEPARTMENT_OTHER): Payer: Self-pay

## 2023-06-19 ENCOUNTER — Other Ambulatory Visit (HOSPITAL_BASED_OUTPATIENT_CLINIC_OR_DEPARTMENT_OTHER): Payer: Self-pay

## 2023-06-19 MED ORDER — FLUOXETINE HCL 40 MG PO CAPS
40.0000 mg | ORAL_CAPSULE | Freq: Every morning | ORAL | 3 refills | Status: AC
  Filled 2023-06-28: qty 30, 30d supply, fill #0

## 2023-06-19 MED ORDER — TRAZODONE HCL 100 MG PO TABS
100.0000 mg | ORAL_TABLET | Freq: Every day | ORAL | 3 refills | Status: AC
  Filled 2023-09-08: qty 30, 30d supply, fill #0
  Filled 2024-02-26: qty 30, 30d supply, fill #1
  Filled 2024-04-12: qty 30, 30d supply, fill #2

## 2023-06-19 MED ORDER — FLUOXETINE HCL 40 MG PO CAPS: 40.0000 mg | ORAL_CAPSULE | Freq: Every morning | ORAL | 3 refills | Status: AC

## 2023-06-19 MED ORDER — BUPROPION HCL ER (XL) 300 MG PO TB24
300.0000 mg | ORAL_TABLET | Freq: Every morning | ORAL | 10 refills | Status: AC
  Filled 2023-06-28: qty 30, 30d supply, fill #0

## 2023-06-19 MED ORDER — TRAZODONE HCL 100 MG PO TABS
100.0000 mg | ORAL_TABLET | Freq: Every evening | ORAL | 1 refills | Status: DC
  Filled 2023-06-28: qty 30, 30d supply, fill #0

## 2023-06-20 DIAGNOSIS — F3289 Other specified depressive episodes: Secondary | ICD-10-CM | POA: Diagnosis not present

## 2023-06-20 DIAGNOSIS — F900 Attention-deficit hyperactivity disorder, predominantly inattentive type: Secondary | ICD-10-CM | POA: Diagnosis not present

## 2023-06-20 DIAGNOSIS — F418 Other specified anxiety disorders: Secondary | ICD-10-CM | POA: Diagnosis not present

## 2023-06-20 DIAGNOSIS — F1010 Alcohol abuse, uncomplicated: Secondary | ICD-10-CM | POA: Diagnosis not present

## 2023-06-22 DIAGNOSIS — E663 Overweight: Secondary | ICD-10-CM | POA: Diagnosis not present

## 2023-06-22 DIAGNOSIS — Z713 Dietary counseling and surveillance: Secondary | ICD-10-CM | POA: Diagnosis not present

## 2023-06-27 DIAGNOSIS — F900 Attention-deficit hyperactivity disorder, predominantly inattentive type: Secondary | ICD-10-CM | POA: Diagnosis not present

## 2023-06-27 DIAGNOSIS — F418 Other specified anxiety disorders: Secondary | ICD-10-CM | POA: Diagnosis not present

## 2023-06-27 DIAGNOSIS — F3289 Other specified depressive episodes: Secondary | ICD-10-CM | POA: Diagnosis not present

## 2023-06-27 DIAGNOSIS — F1010 Alcohol abuse, uncomplicated: Secondary | ICD-10-CM | POA: Diagnosis not present

## 2023-06-28 ENCOUNTER — Other Ambulatory Visit (HOSPITAL_BASED_OUTPATIENT_CLINIC_OR_DEPARTMENT_OTHER): Payer: Self-pay

## 2023-06-28 DIAGNOSIS — F5105 Insomnia due to other mental disorder: Secondary | ICD-10-CM | POA: Diagnosis not present

## 2023-06-28 DIAGNOSIS — F429 Obsessive-compulsive disorder, unspecified: Secondary | ICD-10-CM | POA: Diagnosis not present

## 2023-06-28 DIAGNOSIS — F331 Major depressive disorder, recurrent, moderate: Secondary | ICD-10-CM | POA: Diagnosis not present

## 2023-06-28 MED ORDER — METHYLPHENIDATE HCL 5 MG PO TABS
5.0000 mg | ORAL_TABLET | Freq: Two times a day (BID) | ORAL | 0 refills | Status: DC
Start: 2023-06-28 — End: 2023-07-12
  Filled 2023-06-28: qty 21, 11d supply, fill #0

## 2023-07-04 DIAGNOSIS — F418 Other specified anxiety disorders: Secondary | ICD-10-CM | POA: Diagnosis not present

## 2023-07-04 DIAGNOSIS — F900 Attention-deficit hyperactivity disorder, predominantly inattentive type: Secondary | ICD-10-CM | POA: Diagnosis not present

## 2023-07-04 DIAGNOSIS — F1010 Alcohol abuse, uncomplicated: Secondary | ICD-10-CM | POA: Diagnosis not present

## 2023-07-04 DIAGNOSIS — F3289 Other specified depressive episodes: Secondary | ICD-10-CM | POA: Diagnosis not present

## 2023-07-11 DIAGNOSIS — F1010 Alcohol abuse, uncomplicated: Secondary | ICD-10-CM | POA: Diagnosis not present

## 2023-07-11 DIAGNOSIS — F3289 Other specified depressive episodes: Secondary | ICD-10-CM | POA: Diagnosis not present

## 2023-07-11 DIAGNOSIS — F418 Other specified anxiety disorders: Secondary | ICD-10-CM | POA: Diagnosis not present

## 2023-07-11 DIAGNOSIS — F900 Attention-deficit hyperactivity disorder, predominantly inattentive type: Secondary | ICD-10-CM | POA: Diagnosis not present

## 2023-07-12 ENCOUNTER — Other Ambulatory Visit (HOSPITAL_BASED_OUTPATIENT_CLINIC_OR_DEPARTMENT_OTHER): Payer: Self-pay

## 2023-07-12 ENCOUNTER — Other Ambulatory Visit: Payer: Self-pay

## 2023-07-12 MED ORDER — METHYLPHENIDATE HCL 5 MG PO TABS
5.0000 mg | ORAL_TABLET | Freq: Two times a day (BID) | ORAL | 0 refills | Status: AC
  Filled 2023-07-12: qty 60, 30d supply, fill #0

## 2023-07-12 MED ORDER — HYDROXYZINE PAMOATE 25 MG PO CAPS
25.0000 mg | ORAL_CAPSULE | Freq: Every day | ORAL | 0 refills | Status: DC
  Filled 2023-07-12: qty 21, 21d supply, fill #0

## 2023-07-13 DIAGNOSIS — E663 Overweight: Secondary | ICD-10-CM | POA: Diagnosis not present

## 2023-07-13 DIAGNOSIS — Z713 Dietary counseling and surveillance: Secondary | ICD-10-CM | POA: Diagnosis not present

## 2023-07-14 ENCOUNTER — Ambulatory Visit: Admitting: Obstetrics and Gynecology

## 2023-07-14 ENCOUNTER — Other Ambulatory Visit (HOSPITAL_BASED_OUTPATIENT_CLINIC_OR_DEPARTMENT_OTHER): Payer: Self-pay

## 2023-07-14 ENCOUNTER — Other Ambulatory Visit (HOSPITAL_COMMUNITY)
Admission: RE | Admit: 2023-07-14 | Discharge: 2023-07-14 | Disposition: A | Discharge status: Home / Self Care | Source: Ambulatory Visit | Attending: Obstetrics and Gynecology | Admitting: Obstetrics and Gynecology

## 2023-07-14 ENCOUNTER — Encounter: Payer: Self-pay | Admitting: Obstetrics and Gynecology

## 2023-07-14 VITALS — BP 115/67 | HR 68 | Ht 68.0 in | Wt 187.0 lb

## 2023-07-14 DIAGNOSIS — Z01419 Encounter for gynecological examination (general) (routine) without abnormal findings: Secondary | ICD-10-CM | POA: Insufficient documentation

## 2023-07-14 DIAGNOSIS — N946 Dysmenorrhea, unspecified: Secondary | ICD-10-CM | POA: Diagnosis not present

## 2023-07-14 DIAGNOSIS — Z1339 Encounter for screening examination for other mental health and behavioral disorders: Secondary | ICD-10-CM

## 2023-07-14 DIAGNOSIS — N920 Excessive and frequent menstruation with regular cycle: Secondary | ICD-10-CM | POA: Diagnosis not present

## 2023-07-14 MED ORDER — DROSPIRENONE-ETHINYL ESTRADIOL 3-0.02 MG PO TABS
1.0000 | ORAL_TABLET | Freq: Every day | ORAL | 11 refills | Status: AC
  Filled 2023-07-14: qty 28, 28d supply, fill #0

## 2023-07-14 NOTE — Progress Notes (Signed)
 Wants to discuss BCM. Wants estrogen checked. Last pap 2023. Normal. Hx abnormal.

## 2023-07-14 NOTE — Progress Notes (Signed)
 ANNUAL EXAM Patient name: Mary Flowers MRN [id number]  Date of birth: [birth year] Chief Complaint:   Establish Care  History of Present Illness:   Mary Flowers is a 38 y.o. G1P0  female being seen today for a routine annual exam.  Current complaints:  believes has hemorrhagic cyst (was told to get follow up every 6 months to monitor growth a while ago. Endorses heavy period and mood changes leading up to cycle. Reports Oct-jan every month experienced heavy bleeding.  No bleeding outside of period Does report significant dysmenorrhea. 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 Had IUD in the past  Endometriosis runs in family  Biopsy of fibrocystic breast  in the past  Desires estrogen checked  Patient's last menstrual period was 06/17/2023 (approximate).    The pregnancy intention screening data noted above was reviewed. Potential methods of contraception were discussed. The patient elected to proceed with No data recorded.   [place] Results were:  normal . H/O abnormal pap: yes Last mammogram: n/a. Results were: N/A. Breast u/s for fibrocystic breast       07/14/2023   10:06 AM 01/12/2023    4:27 PM  Depression screen PHQ 2/9  Decreased Interest 0 0  Down, Depressed, Hopeless 0 0  PHQ - 2 Score 0 0  Altered sleeping 0   Tired, decreased energy 0   Change in appetite 0   Feeling bad or failure about yourself  0   Trouble concentrating 1   Moving slowly or fidgety/restless 0   Suicidal thoughts 0   PHQ-9 Score 1         07/14/2023   10:07 AM  GAD 7 : Generalized Anxiety Score  Nervous, Anxious, on Edge 2  Control/stop worrying 2  Worry too much - different things 1  Trouble relaxing 1  Restless 0  Easily annoyed or irritable 0  Afraid - awful might happen 0  Total GAD 7 Score 6     Review of Systems:   Pertinent items are noted in HPI Denies any headaches, blurred vision, fatigue, shortness of breath, chest  pain, abdominal pain, abnormal vaginal discharge/itching/odor/irritation, problems with periods, bowel movements, urination, or intercourse unless otherwise stated above. Pertinent History Reviewed:  Reviewed past medical,surgical, social and family history.  Reviewed problem list, medications and allergies. Physical Assessment:   Vitals:   07/14/23 0933  BP: 115/67  Pulse: 68  Weight: 187 lb (84.8 kg)  Height: 5\' 8"  (1.727 m)  Body mass index is 28.43 kg/m.        Physical Examination:   General appearance - well appearing, and in no distress  Mental status - alert, oriented t  Psych:  She has a normal mood and affect  Skin - warm and dry, normal color, no suspicious lesions noted  Chest - effort normal, all lung fields clear to auscultation bilaterally  Heart - normal rate and regular rhythm  Neck:  midline trachea, no thyromegaly  Breasts - breasts appear normal, no suspicious masses, no skin or nipple changes or  axillary nodes  Abdomen - soft, nontender, nondistended, no masses or organomegaly  Pelvic - VULVA: normal appearing vulva with no masses, tenderness or lesions  VAGINA: normal appearing vagina with normal color and discharge, no lesions  CERVIX: normal appearing cervix without discharge or lesions, no CMT  Thin prep pap is done w HR HPV cotesting  UTERUS: uterus is felt to be normal  size, shape, consistency and nontender   ADNEXA: No adnexal masses or tenderness noted.  Extremities:  No swelling or varicosities noted  Chaperone present for exam  No results found for this or any previous visit (from the past 24 hours).  Assessment & Plan:  1. Encounter for annual routine gynecological examination (Primary) Encouraged self breast exams Following with PCP  - Cytology - PAP( [place])  2. Dysmenorrhea 3. Menorrhagia with regular cycle - Discussed different options available. Discussed two main options include OCP, also she is considering IUD . Discussed hormonal  vs nonhormonal IUD  - For hormonal birth control, discussed yaz  is FDA approved for pmdd and can aid in cycle management as well.  -will come back for IUD insertion  - - Estrogens, Total   Labs/procedures today:   Mammogram: @ 40yo, or sooner if problems   Orders Placed This Encounter  Procedures   Estrogens, Total    Meds:  Meds ordered this encounter  Medications   drospirenone-ethinyl estradiol (YAZ) 3-0.02 MG tablet    Sig: Take 1 tablet by mouth daily.    Dispense:  28 tablet    Refill:  11    Follow-up: Return for IUD insertion Future Appointments  Date Time Provider Department Center  08/02/2023  3:30 PM Zelma Hidden, FNP [facility] None    Susi Eric, FNP

## 2023-07-15 LAB — ESTROGENS, TOTAL: Estrogen: 294 pg/mL

## 2023-07-16 ENCOUNTER — Encounter: Payer: Self-pay | Admitting: Obstetrics and Gynecology

## 2023-07-18 DIAGNOSIS — F1010 Alcohol abuse, uncomplicated: Secondary | ICD-10-CM | POA: Diagnosis not present

## 2023-07-18 DIAGNOSIS — F900 Attention-deficit hyperactivity disorder, predominantly inattentive type: Secondary | ICD-10-CM | POA: Diagnosis not present

## 2023-07-18 DIAGNOSIS — F418 Other specified anxiety disorders: Secondary | ICD-10-CM | POA: Diagnosis not present

## 2023-07-18 DIAGNOSIS — F3289 Other specified depressive episodes: Secondary | ICD-10-CM | POA: Diagnosis not present

## 2023-07-20 LAB — CYTOLOGY - PAP
Comment: NEGATIVE
Diagnosis: NEGATIVE
High risk HPV: NEGATIVE

## 2023-07-24 ENCOUNTER — Other Ambulatory Visit (HOSPITAL_BASED_OUTPATIENT_CLINIC_OR_DEPARTMENT_OTHER): Payer: Self-pay

## 2023-07-25 DIAGNOSIS — F3289 Other specified depressive episodes: Secondary | ICD-10-CM | POA: Diagnosis not present

## 2023-07-25 DIAGNOSIS — F1010 Alcohol abuse, uncomplicated: Secondary | ICD-10-CM | POA: Diagnosis not present

## 2023-07-25 DIAGNOSIS — F418 Other specified anxiety disorders: Secondary | ICD-10-CM | POA: Diagnosis not present

## 2023-07-25 DIAGNOSIS — F900 Attention-deficit hyperactivity disorder, predominantly inattentive type: Secondary | ICD-10-CM | POA: Diagnosis not present

## 2023-08-01 ENCOUNTER — Other Ambulatory Visit: Payer: Self-pay | Admitting: Obstetrics and Gynecology

## 2023-08-01 ENCOUNTER — Other Ambulatory Visit (HOSPITAL_COMMUNITY): Payer: Self-pay

## 2023-08-01 ENCOUNTER — Telehealth: Payer: Self-pay | Admitting: *Deleted

## 2023-08-01 ENCOUNTER — Other Ambulatory Visit (HOSPITAL_BASED_OUTPATIENT_CLINIC_OR_DEPARTMENT_OTHER): Payer: Self-pay

## 2023-08-01 DIAGNOSIS — F1010 Alcohol abuse, uncomplicated: Secondary | ICD-10-CM | POA: Diagnosis not present

## 2023-08-01 DIAGNOSIS — F429 Obsessive-compulsive disorder, unspecified: Secondary | ICD-10-CM | POA: Diagnosis not present

## 2023-08-01 DIAGNOSIS — F900 Attention-deficit hyperactivity disorder, predominantly inattentive type: Secondary | ICD-10-CM | POA: Diagnosis not present

## 2023-08-01 DIAGNOSIS — F331 Major depressive disorder, recurrent, moderate: Secondary | ICD-10-CM | POA: Diagnosis not present

## 2023-08-01 DIAGNOSIS — F418 Other specified anxiety disorders: Secondary | ICD-10-CM | POA: Diagnosis not present

## 2023-08-01 DIAGNOSIS — F5105 Insomnia due to other mental disorder: Secondary | ICD-10-CM | POA: Diagnosis not present

## 2023-08-01 DIAGNOSIS — F411 Generalized anxiety disorder: Secondary | ICD-10-CM | POA: Diagnosis not present

## 2023-08-01 DIAGNOSIS — F3289 Other specified depressive episodes: Secondary | ICD-10-CM | POA: Diagnosis not present

## 2023-08-01 MED ORDER — TRAZODONE HCL 100 MG PO TABS
100.0000 mg | ORAL_TABLET | Freq: Every day | ORAL | 3 refills | Status: AC
  Filled 2023-08-01: qty 30, 30d supply, fill #0

## 2023-08-01 MED ORDER — BUPROPION HCL ER (XL) 150 MG PO TB24
150.0000 mg | ORAL_TABLET | Freq: Every morning | ORAL | 0 refills | Status: DC
  Filled 2023-08-01: qty 30, 30d supply, fill #0

## 2023-08-01 MED ORDER — METHYLPHENIDATE HCL 10 MG PO TABS
20.0000 mg | ORAL_TABLET | Freq: Every day | ORAL | 0 refills | Status: DC
  Filled 2023-08-19: qty 60, 30d supply, fill #0

## 2023-08-01 MED ORDER — MISOPROSTOL 200 MCG PO TABS
200.0000 ug | ORAL_TABLET | Freq: Once | ORAL | 0 refills | Status: AC
Start: 2023-08-01 — End: 2024-01-26

## 2023-08-01 MED ORDER — FLUOXETINE HCL 40 MG PO CAPS
40.0000 mg | ORAL_CAPSULE | Freq: Every morning | ORAL | 3 refills | Status: AC
  Filled 2023-08-01: qty 30, 30d supply, fill #0
  Filled 2023-09-08: qty 30, 30d supply, fill #1
  Filled 2024-02-26: qty 30, 30d supply, fill #2
  Filled 2024-04-12: qty 30, 30d supply, fill #3

## 2023-08-01 NOTE — Telephone Encounter (Signed)
 RTC regarding medication to be taken before IUD placement. RX for cytotec not originally sent. Provider was contacted and RX misoprostol is now sent to pharmacy for pt. No answer. Left HIPAA compliant VM with call back number and sent MyChart message.

## 2023-08-01 NOTE — Progress Notes (Signed)
 Med sent for iud insertion

## 2023-08-02 ENCOUNTER — Encounter: Payer: Self-pay | Admitting: Obstetrics and Gynecology

## 2023-08-02 ENCOUNTER — Ambulatory Visit: Admitting: Obstetrics and Gynecology

## 2023-08-02 VITALS — BP 125/83 | HR 72 | Wt 189.6 lb

## 2023-08-02 DIAGNOSIS — Z3009 Encounter for other general counseling and advice on contraception: Secondary | ICD-10-CM

## 2023-08-02 DIAGNOSIS — Z30430 Encounter for insertion of intrauterine contraceptive device: Secondary | ICD-10-CM

## 2023-08-02 MED ORDER — LEVONORGESTREL 20 MCG/DAY IU IUD
1.0000 | INTRAUTERINE_SYSTEM | Freq: Once | INTRAUTERINE | Status: AC
  Administered 2023-08-02: 1 via INTRAUTERINE

## 2023-08-02 NOTE — Progress Notes (Signed)
 Would like further explanation of the procedure.

## 2023-08-02 NOTE — Progress Notes (Signed)
   GYNECOLOGY CLINIC PROCEDURE NOTE  LAKRISHA BATDORF is a 38 y.o. G1P0 here for Mirena IUD insertion. No GYN concerns.  Last pap smear was on 07/2023 and was normal.  IUD Insertion Procedure Note Patient identified, informed consent performed, consent signed.   Discussed risks of irregular bleeding, cramping, infection, malpositioning or misplacement of the IUD outside the uterus. Urine pregnancy test negative.  Speculum placed in the vagina.  Cervix visualized.  Cleaned with Betadine x 2.  Grasped anteriorly with a single tooth tenaculum.  Uterus sounded to 7.5 cm.  Mirena  IUD placed per manufacturer's recommendations.  Strings trimmed to 3 cm. Tenaculum was removed, good hemostasis noted.  Patient tolerated procedure well.   Patient was given post-procedure instructions.  She was advised to have backup contraception for one week.  Patient was also asked to check IUD strings periodically and follow up in 4 weeks for IUD check.   Susi Eric, FNP

## 2023-08-02 NOTE — Patient Instructions (Signed)

## 2023-08-05 DIAGNOSIS — F4323 Adjustment disorder with mixed anxiety and depressed mood: Secondary | ICD-10-CM | POA: Diagnosis not present

## 2023-08-08 ENCOUNTER — Other Ambulatory Visit (HOSPITAL_BASED_OUTPATIENT_CLINIC_OR_DEPARTMENT_OTHER): Payer: Self-pay

## 2023-08-08 DIAGNOSIS — F418 Other specified anxiety disorders: Secondary | ICD-10-CM | POA: Diagnosis not present

## 2023-08-08 DIAGNOSIS — F1010 Alcohol abuse, uncomplicated: Secondary | ICD-10-CM | POA: Diagnosis not present

## 2023-08-08 DIAGNOSIS — F900 Attention-deficit hyperactivity disorder, predominantly inattentive type: Secondary | ICD-10-CM | POA: Diagnosis not present

## 2023-08-08 DIAGNOSIS — F3289 Other specified depressive episodes: Secondary | ICD-10-CM | POA: Diagnosis not present

## 2023-08-08 MED ORDER — HYDROXYZINE PAMOATE 25 MG PO CAPS
ORAL_CAPSULE | ORAL | 0 refills | Status: AC
  Filled 2023-08-08 – 2023-08-19 (×2): qty 21, 21d supply, fill #0

## 2023-08-12 DIAGNOSIS — F4323 Adjustment disorder with mixed anxiety and depressed mood: Secondary | ICD-10-CM | POA: Diagnosis not present

## 2023-08-15 DIAGNOSIS — F900 Attention-deficit hyperactivity disorder, predominantly inattentive type: Secondary | ICD-10-CM | POA: Diagnosis not present

## 2023-08-15 DIAGNOSIS — F418 Other specified anxiety disorders: Secondary | ICD-10-CM | POA: Diagnosis not present

## 2023-08-15 DIAGNOSIS — F3289 Other specified depressive episodes: Secondary | ICD-10-CM | POA: Diagnosis not present

## 2023-08-15 DIAGNOSIS — F1010 Alcohol abuse, uncomplicated: Secondary | ICD-10-CM | POA: Diagnosis not present

## 2023-08-18 ENCOUNTER — Other Ambulatory Visit (HOSPITAL_BASED_OUTPATIENT_CLINIC_OR_DEPARTMENT_OTHER): Payer: Self-pay

## 2023-08-19 ENCOUNTER — Other Ambulatory Visit (HOSPITAL_BASED_OUTPATIENT_CLINIC_OR_DEPARTMENT_OTHER): Payer: Self-pay

## 2023-08-19 DIAGNOSIS — F4323 Adjustment disorder with mixed anxiety and depressed mood: Secondary | ICD-10-CM | POA: Diagnosis not present

## 2023-08-22 DIAGNOSIS — F3289 Other specified depressive episodes: Secondary | ICD-10-CM | POA: Diagnosis not present

## 2023-08-22 DIAGNOSIS — F418 Other specified anxiety disorders: Secondary | ICD-10-CM | POA: Diagnosis not present

## 2023-08-22 DIAGNOSIS — F900 Attention-deficit hyperactivity disorder, predominantly inattentive type: Secondary | ICD-10-CM | POA: Diagnosis not present

## 2023-08-22 DIAGNOSIS — F1010 Alcohol abuse, uncomplicated: Secondary | ICD-10-CM | POA: Diagnosis not present

## 2023-08-29 DIAGNOSIS — F418 Other specified anxiety disorders: Secondary | ICD-10-CM | POA: Diagnosis not present

## 2023-08-29 DIAGNOSIS — F1010 Alcohol abuse, uncomplicated: Secondary | ICD-10-CM | POA: Diagnosis not present

## 2023-08-29 DIAGNOSIS — F900 Attention-deficit hyperactivity disorder, predominantly inattentive type: Secondary | ICD-10-CM | POA: Diagnosis not present

## 2023-08-29 DIAGNOSIS — F3289 Other specified depressive episodes: Secondary | ICD-10-CM | POA: Diagnosis not present

## 2023-08-30 ENCOUNTER — Ambulatory Visit: Admitting: Advanced Practice Midwife

## 2023-08-31 ENCOUNTER — Other Ambulatory Visit (HOSPITAL_BASED_OUTPATIENT_CLINIC_OR_DEPARTMENT_OTHER): Payer: Self-pay

## 2023-08-31 DIAGNOSIS — F411 Generalized anxiety disorder: Secondary | ICD-10-CM | POA: Diagnosis not present

## 2023-08-31 DIAGNOSIS — F429 Obsessive-compulsive disorder, unspecified: Secondary | ICD-10-CM | POA: Diagnosis not present

## 2023-08-31 DIAGNOSIS — F331 Major depressive disorder, recurrent, moderate: Secondary | ICD-10-CM | POA: Diagnosis not present

## 2023-08-31 DIAGNOSIS — F5105 Insomnia due to other mental disorder: Secondary | ICD-10-CM | POA: Diagnosis not present

## 2023-08-31 MED ORDER — HYDROXYZINE PAMOATE 25 MG PO CAPS
25.0000 mg | ORAL_CAPSULE | Freq: Every day | ORAL | 1 refills | Status: AC | PRN
  Filled 2023-08-31 – 2023-09-08 (×2): qty 30, 30d supply, fill #0

## 2023-09-05 DIAGNOSIS — F3289 Other specified depressive episodes: Secondary | ICD-10-CM | POA: Diagnosis not present

## 2023-09-05 DIAGNOSIS — F900 Attention-deficit hyperactivity disorder, predominantly inattentive type: Secondary | ICD-10-CM | POA: Diagnosis not present

## 2023-09-05 DIAGNOSIS — F1010 Alcohol abuse, uncomplicated: Secondary | ICD-10-CM | POA: Diagnosis not present

## 2023-09-05 DIAGNOSIS — F418 Other specified anxiety disorders: Secondary | ICD-10-CM | POA: Diagnosis not present

## 2023-09-06 ENCOUNTER — Ambulatory Visit: Admitting: Family Medicine

## 2023-09-08 ENCOUNTER — Encounter (HOSPITAL_BASED_OUTPATIENT_CLINIC_OR_DEPARTMENT_OTHER): Payer: Self-pay

## 2023-09-08 ENCOUNTER — Other Ambulatory Visit: Payer: Self-pay

## 2023-09-08 ENCOUNTER — Other Ambulatory Visit (HOSPITAL_BASED_OUTPATIENT_CLINIC_OR_DEPARTMENT_OTHER): Payer: Self-pay

## 2023-09-08 MED ORDER — TRAZODONE HCL 100 MG PO TABS
100.0000 mg | ORAL_TABLET | Freq: Every day | ORAL | 1 refills | Status: AC
  Filled 2023-09-08 – 2023-11-06 (×2): qty 30, 30d supply, fill #0
  Filled 2023-12-05: qty 30, 30d supply, fill #1

## 2023-09-08 MED ORDER — BUPROPION HCL ER (XL) 150 MG PO TB24
150.0000 mg | ORAL_TABLET | Freq: Every morning | ORAL | 0 refills | Status: DC
  Filled 2023-09-08: qty 30, 30d supply, fill #0

## 2023-09-12 ENCOUNTER — Telehealth: Payer: Self-pay | Admitting: Licensed Clinical Social Worker

## 2023-09-12 DIAGNOSIS — F418 Other specified anxiety disorders: Secondary | ICD-10-CM | POA: Diagnosis not present

## 2023-09-12 DIAGNOSIS — F3289 Other specified depressive episodes: Secondary | ICD-10-CM | POA: Diagnosis not present

## 2023-09-12 DIAGNOSIS — F1010 Alcohol abuse, uncomplicated: Secondary | ICD-10-CM | POA: Diagnosis not present

## 2023-09-12 DIAGNOSIS — F900 Attention-deficit hyperactivity disorder, predominantly inattentive type: Secondary | ICD-10-CM | POA: Diagnosis not present

## 2023-09-12 NOTE — Telephone Encounter (Signed)
 [facility] contacted patient on this date to provide [facility] intro and to schedule [facility] appointment. [facility] left VM.

## 2023-09-13 ENCOUNTER — Encounter: Payer: Self-pay | Admitting: Internal Medicine

## 2023-09-13 DIAGNOSIS — Z1283 Encounter for screening for malignant neoplasm of skin: Secondary | ICD-10-CM

## 2023-09-21 DIAGNOSIS — H5713 Ocular pain, bilateral: Secondary | ICD-10-CM | POA: Diagnosis not present

## 2023-09-26 DIAGNOSIS — F3289 Other specified depressive episodes: Secondary | ICD-10-CM | POA: Diagnosis not present

## 2023-09-26 DIAGNOSIS — F900 Attention-deficit hyperactivity disorder, predominantly inattentive type: Secondary | ICD-10-CM | POA: Diagnosis not present

## 2023-09-26 DIAGNOSIS — F1010 Alcohol abuse, uncomplicated: Secondary | ICD-10-CM | POA: Diagnosis not present

## 2023-09-26 DIAGNOSIS — F418 Other specified anxiety disorders: Secondary | ICD-10-CM | POA: Diagnosis not present

## 2023-09-28 ENCOUNTER — Other Ambulatory Visit (HOSPITAL_BASED_OUTPATIENT_CLINIC_OR_DEPARTMENT_OTHER): Payer: Self-pay

## 2023-09-28 MED ORDER — BUPROPION HCL ER (XL) 150 MG PO TB24
150.0000 mg | ORAL_TABLET | Freq: Every morning | ORAL | 0 refills | Status: AC
  Filled 2024-02-26: qty 30, 30d supply, fill #0

## 2023-09-28 MED ORDER — METHYLPHENIDATE HCL 10 MG PO TABS
20.0000 mg | ORAL_TABLET | Freq: Every day | ORAL | 0 refills | Status: DC
  Filled 2023-09-28: qty 60, 30d supply, fill #0

## 2023-10-02 DIAGNOSIS — Z973 Presence of spectacles and contact lenses: Secondary | ICD-10-CM | POA: Diagnosis not present

## 2023-10-02 DIAGNOSIS — H04123 Dry eye syndrome of bilateral lacrimal glands: Secondary | ICD-10-CM | POA: Diagnosis not present

## 2023-10-02 DIAGNOSIS — H578A1 Foreign body sensation, right eye: Secondary | ICD-10-CM | POA: Diagnosis not present

## 2023-10-03 DIAGNOSIS — F1010 Alcohol abuse, uncomplicated: Secondary | ICD-10-CM | POA: Diagnosis not present

## 2023-10-03 DIAGNOSIS — F3289 Other specified depressive episodes: Secondary | ICD-10-CM | POA: Diagnosis not present

## 2023-10-03 DIAGNOSIS — F900 Attention-deficit hyperactivity disorder, predominantly inattentive type: Secondary | ICD-10-CM | POA: Diagnosis not present

## 2023-10-03 DIAGNOSIS — F418 Other specified anxiety disorders: Secondary | ICD-10-CM | POA: Diagnosis not present

## 2023-10-04 ENCOUNTER — Encounter: Payer: Self-pay | Admitting: Internal Medicine

## 2023-10-04 ENCOUNTER — Ambulatory Visit: Admitting: Internal Medicine

## 2023-10-04 VITALS — BP 120/80 | HR 70 | Temp 98.0°F | Wt 191.4 lb

## 2023-10-04 DIAGNOSIS — E663 Overweight: Secondary | ICD-10-CM | POA: Diagnosis not present

## 2023-10-04 DIAGNOSIS — Z6829 Body mass index (BMI) 29.0-29.9, adult: Secondary | ICD-10-CM | POA: Diagnosis not present

## 2023-10-04 DIAGNOSIS — L659 Nonscarring hair loss, unspecified: Secondary | ICD-10-CM | POA: Diagnosis not present

## 2023-10-04 MED ORDER — TIRZEPATIDE-WEIGHT MANAGEMENT 2.5 MG/0.5ML ~~LOC~~ SOLN: 2.5000 mg | SUBCUTANEOUS | 0 refills | Status: AC

## 2023-10-04 NOTE — Progress Notes (Signed)
 Established Patient Office Visit     CC/Reason for Visit: Discuss acute concerns  HPI: Mary Flowers is a 38 y.o. female who is coming in today for the above mentioned reasons.  She has lymphedema and there is an ongoing clinical research trial that has shown some improvement in lymphedema with GLP-1's.  She tried to get enrolled in the trial but it is based out of [place]  and they do not offer travel compensation.  She has also been having some hair loss, generalized.   Past Medical/Surgical History: Past Medical History:  Diagnosis Date   Anxiety    Anxiety disorder    Biliary dyskinesia    Complication of anesthesia    GERD (gastroesophageal reflux disease)    Lymph edema LLE   OCD (obsessive compulsive disorder)    PMDD (premenstrual dysphoric disorder)    PONV (postoperative nausea and vomiting)     Past Surgical History:  Procedure Laterality Date   CHOLECYSTECTOMY  06/13/2013   Procedure: LAPAROSCOPIC CHOLECYSTECTOMY;  Surgeon: Jina Nephew, MD;  Location: [facility] OR;  Service: General;;   WISDOM TOOTH EXTRACTION      Social History:  reports that she has quit smoking. Her smoking use included cigarettes. She has never used smokeless tobacco. She reports current alcohol use. She reports that she does not use drugs.  Allergies: No Known Allergies  Family History:  Family History  Problem Relation Age of Onset   Cancer Maternal Grandmother    Diabetes Maternal Grandfather    Heart disease Paternal Grandfather    Stroke Paternal Grandfather      Current Outpatient Medications:    buPROPion  (WELLBUTRIN  XL) 150 MG 24 hr tablet, Take 1 tablet (150 mg total) by mouth every morning., Disp: 30 tablet, Rfl: 0   FLUoxetine  (PROZAC ) 40 MG capsule, Take 1 capsule (40 mg total) by mouth daily., Disp: 90 capsule, Rfl: 1   hydrOXYzine  (VISTARIL ) 25 MG capsule, Take 1 capsule (25 mg total) by mouth daily as needed for increased anxiety., Disp: 30 capsule, Rfl: 1    Melatonin 1 MG CAPS, Take by mouth., Disp: , Rfl:    methylphenidate  (RITALIN ) 10 MG tablet, Take 2 tablets (20 mg total) by mouth daily., Disp: 60 tablet, Rfl: 0   methylphenidate  (RITALIN ) 5 MG tablet, Take 1 tablet (5 mg total) by mouth 2 (two) times daily., Disp: 60 tablet, Rfl: 0   tirzepatide (ZEPBOUND) 2.5 MG/0.5ML injection vial, Inject 2.5 mg into the skin once a week., Disp: 2 mL, Rfl: 0   traZODone  (DESYREL ) 100 MG tablet, Take 0.5 tablets (50 mg total) by mouth at bedtime., Disp: 90 tablet, Rfl: 1   buPROPion  (WELLBUTRIN  XL) 300 MG 24 hr tablet, Take 300 mg by mouth daily. (Patient not taking: Reported on 08/02/2023), Disp: , Rfl:    buPROPion  (WELLBUTRIN  XL) 300 MG 24 hr tablet, Take 1 tablet (300 mg total) by mouth in the morning. (Patient not taking: Reported on 08/02/2023), Disp: 30 tablet, Rfl: 10   drospirenone -ethinyl estradiol  (YAZ) 3-0.02 MG tablet, Take 1 tablet by mouth daily. (Patient not taking: Reported on 08/02/2023), Disp: 28 tablet, Rfl: 11   FLUoxetine  (PROZAC ) 40 MG capsule, Take 1 capsule (40 mg total) by mouth in the morning. (Patient not taking: Reported on 08/02/2023), Disp: 30 capsule, Rfl: 3   FLUoxetine  (PROZAC ) 40 MG capsule, Take 1 capsule (40 mg total) by mouth in the morning. (Patient not taking: Reported on 08/02/2023), Disp: 30 capsule, Rfl: 3  FLUoxetine  (PROZAC ) 40 MG capsule, Take 1 capsule (40 mg total) by mouth every morning. (Patient not taking: Reported on 08/02/2023), Disp: 30 capsule, Rfl: 3   hydrOXYzine  (VISTARIL ) 25 MG capsule, Take 1 tablet (25 mg) by mouth daily as needed for increased anxiety, Disp: 21 capsule, Rfl: 0   misoprostol  (CYTOTEC ) 200 MCG tablet, Take 1 tablet (200 mcg total) by mouth once for 1 dose. Take 1 tablet by mouth 1-2 hours prior to procedure, Disp: 1 tablet, Rfl: 0   traZODone  (DESYREL ) 100 MG tablet, Take 1 tablet (100 mg total) by mouth at bedtime. (Patient not taking: Reported on 08/02/2023), Disp: 30 tablet, Rfl: 3    traZODone  (DESYREL ) 100 MG tablet, Take one tablet by mouth at bedtime (Patient not taking: Reported on 08/02/2023), Disp: 30 tablet, Rfl: 3   traZODone  (DESYREL ) 100 MG tablet, Take 1 tablet (100 mg total) by mouth at bedtime., Disp: 30 tablet, Rfl: 1  Review of Systems:  Negative unless indicated in HPI.   Physical Exam: Vitals:   10/04/23 0748  BP: 120/80  Pulse: 70  Temp: 98 F (36.7 C)  TempSrc: Oral  SpO2: 99%  Weight: 191 lb 6.4 oz (86.8 kg)    Body mass index is 29.1 kg/m.   Impression and Plan:  Overweight (BMI 25.0-29.9) -     Tirzepatide-Weight Management; Inject 2.5 mg into the skin once a week.  Dispense: 2 mL; Refill: 0  Hair loss -     Ambulatory referral to Dermatology   - She qualifies for GLP-1 for weight loss based on her BMI.  I will place prescription and send prior authorization. - Have advised good-quality multivitamin and biotin supplements for her hair loss.  Recent TSH was normal.  Dermatology referral placed.  Time spent:31 minutes reviewing chart, interviewing and examining patient and formulating plan of care.     Tully Theophilus Andrews, MD [facility]

## 2023-10-10 NOTE — Addendum Note (Signed)
 Addended by: KATHRYNE MILLMAN B on: 10/10/2023 08:48 AM   Modules accepted: Orders

## 2023-10-18 ENCOUNTER — Other Ambulatory Visit (HOSPITAL_BASED_OUTPATIENT_CLINIC_OR_DEPARTMENT_OTHER): Payer: Self-pay

## 2023-10-18 MED ORDER — BUPROPION HCL ER (XL) 150 MG PO TB24
150.0000 mg | ORAL_TABLET | Freq: Every morning | ORAL | 3 refills | Status: AC
  Filled 2023-10-18: qty 30, 30d supply, fill #0
  Filled 2023-11-06 – 2023-12-05 (×2): qty 30, 30d supply, fill #1
  Filled 2024-01-12: qty 30, 30d supply, fill #2
  Filled 2024-04-12: qty 30, 30d supply, fill #3

## 2023-10-18 MED ORDER — METHYLPHENIDATE HCL 10 MG PO TABS
20.0000 mg | ORAL_TABLET | Freq: Every day | ORAL | 0 refills | Status: DC
  Filled 2023-11-06: qty 60, 30d supply, fill #0

## 2023-10-18 MED ORDER — FLUOXETINE HCL 40 MG PO CAPS
40.0000 mg | ORAL_CAPSULE | Freq: Every morning | ORAL | 3 refills | Status: AC
  Filled 2023-10-18: qty 30, 30d supply, fill #0
  Filled 2023-11-06 – 2023-12-05 (×2): qty 30, 30d supply, fill #1
  Filled 2024-01-12: qty 30, 30d supply, fill #2

## 2023-11-06 ENCOUNTER — Other Ambulatory Visit (HOSPITAL_BASED_OUTPATIENT_CLINIC_OR_DEPARTMENT_OTHER): Payer: Self-pay

## 2023-11-06 ENCOUNTER — Other Ambulatory Visit: Payer: Self-pay

## 2023-11-07 ENCOUNTER — Other Ambulatory Visit (HOSPITAL_BASED_OUTPATIENT_CLINIC_OR_DEPARTMENT_OTHER): Payer: Self-pay

## 2023-12-05 ENCOUNTER — Other Ambulatory Visit (HOSPITAL_BASED_OUTPATIENT_CLINIC_OR_DEPARTMENT_OTHER): Payer: Self-pay

## 2023-12-05 ENCOUNTER — Other Ambulatory Visit: Payer: Self-pay

## 2023-12-06 ENCOUNTER — Other Ambulatory Visit (HOSPITAL_BASED_OUTPATIENT_CLINIC_OR_DEPARTMENT_OTHER): Payer: Self-pay

## 2023-12-08 ENCOUNTER — Other Ambulatory Visit (HOSPITAL_BASED_OUTPATIENT_CLINIC_OR_DEPARTMENT_OTHER): Payer: Self-pay

## 2023-12-08 ENCOUNTER — Encounter (HOSPITAL_BASED_OUTPATIENT_CLINIC_OR_DEPARTMENT_OTHER): Payer: Self-pay

## 2023-12-12 DIAGNOSIS — F3289 Other specified depressive episodes: Secondary | ICD-10-CM | POA: Diagnosis not present

## 2023-12-12 DIAGNOSIS — F428 Other obsessive-compulsive disorder: Secondary | ICD-10-CM | POA: Diagnosis not present

## 2023-12-12 DIAGNOSIS — F418 Other specified anxiety disorders: Secondary | ICD-10-CM | POA: Diagnosis not present

## 2023-12-12 DIAGNOSIS — F900 Attention-deficit hyperactivity disorder, predominantly inattentive type: Secondary | ICD-10-CM | POA: Diagnosis not present

## 2023-12-12 DIAGNOSIS — F1010 Alcohol abuse, uncomplicated: Secondary | ICD-10-CM | POA: Diagnosis not present

## 2023-12-15 ENCOUNTER — Other Ambulatory Visit (HOSPITAL_BASED_OUTPATIENT_CLINIC_OR_DEPARTMENT_OTHER): Payer: Self-pay

## 2023-12-19 ENCOUNTER — Encounter (HOSPITAL_BASED_OUTPATIENT_CLINIC_OR_DEPARTMENT_OTHER): Payer: Self-pay

## 2023-12-19 ENCOUNTER — Other Ambulatory Visit (HOSPITAL_BASED_OUTPATIENT_CLINIC_OR_DEPARTMENT_OTHER): Payer: Self-pay

## 2023-12-20 ENCOUNTER — Other Ambulatory Visit (HOSPITAL_BASED_OUTPATIENT_CLINIC_OR_DEPARTMENT_OTHER): Payer: Self-pay

## 2023-12-20 DIAGNOSIS — F429 Obsessive-compulsive disorder, unspecified: Secondary | ICD-10-CM | POA: Diagnosis not present

## 2023-12-20 DIAGNOSIS — F331 Major depressive disorder, recurrent, moderate: Secondary | ICD-10-CM | POA: Diagnosis not present

## 2023-12-20 DIAGNOSIS — F5105 Insomnia due to other mental disorder: Secondary | ICD-10-CM | POA: Diagnosis not present

## 2023-12-20 DIAGNOSIS — F411 Generalized anxiety disorder: Secondary | ICD-10-CM | POA: Diagnosis not present

## 2023-12-20 MED ORDER — METHYLPHENIDATE HCL 10 MG PO TABS
15.0000 mg | ORAL_TABLET | Freq: Two times a day (BID) | ORAL | 0 refills | Status: DC
  Filled 2023-12-20: qty 90, 30d supply, fill #0

## 2023-12-23 DIAGNOSIS — F428 Other obsessive-compulsive disorder: Secondary | ICD-10-CM | POA: Diagnosis not present

## 2023-12-23 DIAGNOSIS — F900 Attention-deficit hyperactivity disorder, predominantly inattentive type: Secondary | ICD-10-CM | POA: Diagnosis not present

## 2023-12-23 DIAGNOSIS — F3289 Other specified depressive episodes: Secondary | ICD-10-CM | POA: Diagnosis not present

## 2023-12-23 DIAGNOSIS — F418 Other specified anxiety disorders: Secondary | ICD-10-CM | POA: Diagnosis not present

## 2023-12-23 DIAGNOSIS — F1010 Alcohol abuse, uncomplicated: Secondary | ICD-10-CM | POA: Diagnosis not present

## 2023-12-26 DIAGNOSIS — F3289 Other specified depressive episodes: Secondary | ICD-10-CM | POA: Diagnosis not present

## 2023-12-26 DIAGNOSIS — F900 Attention-deficit hyperactivity disorder, predominantly inattentive type: Secondary | ICD-10-CM | POA: Diagnosis not present

## 2023-12-26 DIAGNOSIS — F428 Other obsessive-compulsive disorder: Secondary | ICD-10-CM | POA: Diagnosis not present

## 2023-12-26 DIAGNOSIS — F1010 Alcohol abuse, uncomplicated: Secondary | ICD-10-CM | POA: Diagnosis not present

## 2023-12-26 DIAGNOSIS — F418 Other specified anxiety disorders: Secondary | ICD-10-CM | POA: Diagnosis not present

## 2024-01-02 DIAGNOSIS — F428 Other obsessive-compulsive disorder: Secondary | ICD-10-CM | POA: Diagnosis not present

## 2024-01-02 DIAGNOSIS — F900 Attention-deficit hyperactivity disorder, predominantly inattentive type: Secondary | ICD-10-CM | POA: Diagnosis not present

## 2024-01-02 DIAGNOSIS — F1010 Alcohol abuse, uncomplicated: Secondary | ICD-10-CM | POA: Diagnosis not present

## 2024-01-02 DIAGNOSIS — F418 Other specified anxiety disorders: Secondary | ICD-10-CM | POA: Diagnosis not present

## 2024-01-02 DIAGNOSIS — F3289 Other specified depressive episodes: Secondary | ICD-10-CM | POA: Diagnosis not present

## 2024-01-09 DIAGNOSIS — F418 Other specified anxiety disorders: Secondary | ICD-10-CM | POA: Diagnosis not present

## 2024-01-09 DIAGNOSIS — F428 Other obsessive-compulsive disorder: Secondary | ICD-10-CM | POA: Diagnosis not present

## 2024-01-09 DIAGNOSIS — F900 Attention-deficit hyperactivity disorder, predominantly inattentive type: Secondary | ICD-10-CM | POA: Diagnosis not present

## 2024-01-09 DIAGNOSIS — F3289 Other specified depressive episodes: Secondary | ICD-10-CM | POA: Diagnosis not present

## 2024-01-09 DIAGNOSIS — F1010 Alcohol abuse, uncomplicated: Secondary | ICD-10-CM | POA: Diagnosis not present

## 2024-01-12 ENCOUNTER — Other Ambulatory Visit: Payer: Self-pay | Admitting: Medical Genetics

## 2024-01-12 DIAGNOSIS — Z006 Encounter for examination for normal comparison and control in clinical research program: Secondary | ICD-10-CM

## 2024-01-13 ENCOUNTER — Other Ambulatory Visit (HOSPITAL_BASED_OUTPATIENT_CLINIC_OR_DEPARTMENT_OTHER): Payer: Self-pay

## 2024-01-16 DIAGNOSIS — F3289 Other specified depressive episodes: Secondary | ICD-10-CM | POA: Diagnosis not present

## 2024-01-16 DIAGNOSIS — F1010 Alcohol abuse, uncomplicated: Secondary | ICD-10-CM | POA: Diagnosis not present

## 2024-01-16 DIAGNOSIS — F418 Other specified anxiety disorders: Secondary | ICD-10-CM | POA: Diagnosis not present

## 2024-01-16 DIAGNOSIS — F900 Attention-deficit hyperactivity disorder, predominantly inattentive type: Secondary | ICD-10-CM | POA: Diagnosis not present

## 2024-01-16 DIAGNOSIS — F428 Other obsessive-compulsive disorder: Secondary | ICD-10-CM | POA: Diagnosis not present

## 2024-01-18 ENCOUNTER — Other Ambulatory Visit (HOSPITAL_BASED_OUTPATIENT_CLINIC_OR_DEPARTMENT_OTHER): Payer: Self-pay

## 2024-01-18 DIAGNOSIS — F411 Generalized anxiety disorder: Secondary | ICD-10-CM | POA: Diagnosis not present

## 2024-01-18 DIAGNOSIS — F5105 Insomnia due to other mental disorder: Secondary | ICD-10-CM | POA: Diagnosis not present

## 2024-01-18 DIAGNOSIS — F429 Obsessive-compulsive disorder, unspecified: Secondary | ICD-10-CM | POA: Diagnosis not present

## 2024-01-18 DIAGNOSIS — F331 Major depressive disorder, recurrent, moderate: Secondary | ICD-10-CM | POA: Diagnosis not present

## 2024-01-18 MED ORDER — TRAZODONE HCL 100 MG PO TABS
100.0000 mg | ORAL_TABLET | Freq: Every day | ORAL | 3 refills | Status: AC
  Filled 2024-01-18: qty 30, 30d supply, fill #0

## 2024-01-18 MED ORDER — BUPROPION HCL ER (XL) 150 MG PO TB24
150.0000 mg | ORAL_TABLET | Freq: Every morning | ORAL | 3 refills | Status: AC
  Filled 2024-01-18: qty 30, 30d supply, fill #0

## 2024-01-18 MED ORDER — METHYLPHENIDATE HCL ER (OSM) 18 MG PO TBCR
18.0000 mg | EXTENDED_RELEASE_TABLET | Freq: Every morning | ORAL | 0 refills | Status: DC
  Filled 2024-01-18: qty 14, 14d supply, fill #0

## 2024-01-18 MED ORDER — FLUOXETINE HCL 40 MG PO CAPS: 40.0000 mg | ORAL_CAPSULE | Freq: Every morning | ORAL | 3 refills | Status: AC

## 2024-01-19 ENCOUNTER — Other Ambulatory Visit (HOSPITAL_BASED_OUTPATIENT_CLINIC_OR_DEPARTMENT_OTHER): Payer: Self-pay

## 2024-01-23 DIAGNOSIS — F1010 Alcohol abuse, uncomplicated: Secondary | ICD-10-CM | POA: Diagnosis not present

## 2024-01-23 DIAGNOSIS — F3289 Other specified depressive episodes: Secondary | ICD-10-CM | POA: Diagnosis not present

## 2024-01-23 DIAGNOSIS — F428 Other obsessive-compulsive disorder: Secondary | ICD-10-CM | POA: Diagnosis not present

## 2024-01-23 DIAGNOSIS — F900 Attention-deficit hyperactivity disorder, predominantly inattentive type: Secondary | ICD-10-CM | POA: Diagnosis not present

## 2024-01-23 DIAGNOSIS — F418 Other specified anxiety disorders: Secondary | ICD-10-CM | POA: Diagnosis not present

## 2024-01-24 ENCOUNTER — Encounter: Payer: Self-pay | Admitting: Internal Medicine

## 2024-01-25 ENCOUNTER — Ambulatory Visit: Payer: Self-pay

## 2024-01-25 NOTE — Telephone Encounter (Signed)
 Pt will be calling back to schedule appt tomorrow. Pt triage complete.   FYI Only or Action Required?: FYI only for provider.  Patient was last seen in primary care on 10/04/2023 by Mary Flowers, Tully GRADE, MD.  Called Nurse Triage reporting Knee Injury.  Symptoms began several weeks ago.  Interventions attempted: Rest, hydration, or home remedies.  Symptoms are: unchanged.  Triage Disposition: See PCP When Office is Open (Within 3 Days)  Patient/caregiver understands and will follow disposition?: Yes, will follow disposition  Copied from CRM #[id number]. Topic: Clinical - Red Word Triage >> Jan 25, 2024  8:34 AM Viola FALCON wrote: Red Word that prompted transfer to Nurse Triage: Patient fell on my leg that has lymphedema - It's not healing right and she has a dent in my leg where she fell. She scheduled appt via MyChart 10/28 but office requested her to speak with triage nurse Reason for Disposition  [1] After 3 days AND [2] pain not improved  Answer Assessment - Initial Assessment Questions 1. MECHANISM: How did the injury happen? (e.g., twisting injury, direct blow)      Tripped getting out of shower 2. ONSET: When did the injury happen? (e.g., minutes, hours ago)      About 2 weeks ago 3. LOCATION: Where is the injury located?      L leg, has lymphedema on L leg as well 4. APPEARANCE of INJURY: What does the injury look like?      Dented, pt states that she sees a little bit of a bruise 5. SEVERITY: Can you put weight on that leg? Can you walk?      Can still walk and bear weight 6. SIZE: For cuts, bruises, or swelling, ask: How large is it? (e.g., inches or centimeters;  entire joint)      Denies open wounds 7. PAIN: Is there pain? If Yes, ask: How bad is the pain?   What does it keep you from doing? (Scale 0-10; or none, mild, moderate, severe)     Not that bad, decreased sensation in leg d/t lymphedema per pt 9. OTHER SYMPTOMS: Do you have any other  symptoms?  (e.g., pop when knee injured, swelling, locking, buckling)      denies 10. PREGNANCY: Is there any chance you are pregnant? When was your last menstrual period?       Denies  Attempted to schedule pt today and tomorrow, pt asks that she look at her work schedule and call back to schedule tomorrow.  Protocols used: Knee Injury-A-AH

## 2024-01-25 NOTE — Telephone Encounter (Signed)
 Pt calling back to schedule appt. OV scheduled for tomorrow at [facility]. Advised to call for worsening symptoms.

## 2024-01-25 NOTE — Telephone Encounter (Signed)
 noted

## 2024-01-26 ENCOUNTER — Encounter: Payer: Self-pay | Admitting: Family Medicine

## 2024-01-26 ENCOUNTER — Ambulatory Visit: Admitting: Family Medicine

## 2024-01-26 VITALS — BP 96/60 | HR 73 | Temp 98.1°F | Wt 187.2 lb

## 2024-01-26 DIAGNOSIS — S7002XA Contusion of left hip, initial encounter: Secondary | ICD-10-CM | POA: Diagnosis not present

## 2024-01-26 DIAGNOSIS — I890 Lymphedema, not elsewhere classified: Secondary | ICD-10-CM | POA: Diagnosis not present

## 2024-01-26 DIAGNOSIS — S8012XA Contusion of left lower leg, initial encounter: Secondary | ICD-10-CM | POA: Diagnosis not present

## 2024-01-26 NOTE — Progress Notes (Signed)
   Subjective:    Patient ID: Mary Flowers, female    DOB: Feb 22, 1986, 38 y.o.   MRN: [id number]  HPI Here to check on injuries from a fall on 01-08-24. While stepping out of a shower she tripped over the tub wall and fell, landing on her left side. She had some significant bruising over the left lateral hip, and she had bruising on the left lower leg. She has chronic lymphedema in the left leg, so it has been difficult for her to judge how significant an injury this was. She has taken nothing for pain, and she never applied ice. She can walk on the leg with no discomfort at all. She typically wears a compression stocking on the left leg, but she has it off today for exam purposes.    Review of Systems  Constitutional: Negative.   Respiratory: Negative.    Cardiovascular:  Positive for leg swelling.       Objective:   Physical Exam Constitutional:      Appearance: Normal appearance.     Comments: She walks easily   Cardiovascular:     Rate and Rhythm: Normal rate and regular rhythm.     Pulses: Normal pulses.     Heart sounds: Normal heart sounds.  Pulmonary:     Effort: Pulmonary effort is normal.     Breath sounds: Normal breath sounds.  Musculoskeletal:     Comments: She is mildly tender over the left lateral hip, but ROM of the hip is normal. The entire left leg from the hip down is quite swollen and firm. There is a bruised area on the lateral left lower leg that is slightly darker than the surrounding skin. No warmth or erythema. No tenderness.   Neurological:     Mental Status: She is alert.           Assessment & Plan:  She has contusions to the left lateral hip and the left lower leg, but these should heal well over the next few weeks. Recheck as needed.  Garnette Olmsted, MD

## 2024-01-29 ENCOUNTER — Ambulatory Visit: Payer: Self-pay | Admitting: Internal Medicine

## 2024-01-29 NOTE — Telephone Encounter (Signed)
  Appointment cancelled. Pt. Seen last week.  Copied from CRM [id number]. Topic: Clinical - Red Word Triage >> Jan 29, 2024 12:02 PM China J wrote: [facility] that prompted transfer to Nurse Triage: The patient has an appointment scheduled for tomorrow, but due to her symptoms she is needing to be triaged per CAL.  In the appointment notes, the patient explained that she had a fall and her leg that has lymphedema in it is not healing right. Her leg has a dent. Reason for Disposition  [1] Other NON-URGENT information for PCP AND [2] does not require PCP response  Answer Assessment - Initial Assessment Questions 1. REASON FOR CALL or QUESTION: What is your reason for calling today? or How can I best     Pt. Has already been seen for a fall. 2. CALLER: Document the source of call. (e.g., laboratory staff, caregiver or patient).     Pt.  Protocols used: PCP Call - No Triage-A-AH

## 2024-01-30 ENCOUNTER — Ambulatory Visit: Admitting: Internal Medicine

## 2024-01-30 DIAGNOSIS — F3289 Other specified depressive episodes: Secondary | ICD-10-CM | POA: Diagnosis not present

## 2024-01-30 DIAGNOSIS — F1010 Alcohol abuse, uncomplicated: Secondary | ICD-10-CM | POA: Diagnosis not present

## 2024-01-30 DIAGNOSIS — F900 Attention-deficit hyperactivity disorder, predominantly inattentive type: Secondary | ICD-10-CM | POA: Diagnosis not present

## 2024-01-30 DIAGNOSIS — F418 Other specified anxiety disorders: Secondary | ICD-10-CM | POA: Diagnosis not present

## 2024-01-30 DIAGNOSIS — F428 Other obsessive-compulsive disorder: Secondary | ICD-10-CM | POA: Diagnosis not present

## 2024-02-06 DIAGNOSIS — F900 Attention-deficit hyperactivity disorder, predominantly inattentive type: Secondary | ICD-10-CM | POA: Diagnosis not present

## 2024-02-06 DIAGNOSIS — F418 Other specified anxiety disorders: Secondary | ICD-10-CM | POA: Diagnosis not present

## 2024-02-06 DIAGNOSIS — F428 Other obsessive-compulsive disorder: Secondary | ICD-10-CM | POA: Diagnosis not present

## 2024-02-06 DIAGNOSIS — F1010 Alcohol abuse, uncomplicated: Secondary | ICD-10-CM | POA: Diagnosis not present

## 2024-02-06 DIAGNOSIS — F3289 Other specified depressive episodes: Secondary | ICD-10-CM | POA: Diagnosis not present

## 2024-02-15 ENCOUNTER — Other Ambulatory Visit (HOSPITAL_BASED_OUTPATIENT_CLINIC_OR_DEPARTMENT_OTHER): Payer: Self-pay

## 2024-02-15 ENCOUNTER — Other Ambulatory Visit: Payer: Self-pay

## 2024-02-15 DIAGNOSIS — F5105 Insomnia due to other mental disorder: Secondary | ICD-10-CM | POA: Diagnosis not present

## 2024-02-15 DIAGNOSIS — F411 Generalized anxiety disorder: Secondary | ICD-10-CM | POA: Diagnosis not present

## 2024-02-15 DIAGNOSIS — F331 Major depressive disorder, recurrent, moderate: Secondary | ICD-10-CM | POA: Diagnosis not present

## 2024-02-15 DIAGNOSIS — F429 Obsessive-compulsive disorder, unspecified: Secondary | ICD-10-CM | POA: Diagnosis not present

## 2024-02-15 MED ORDER — METHYLPHENIDATE HCL 10 MG PO TABS
10.0000 mg | ORAL_TABLET | Freq: Two times a day (BID) | ORAL | 0 refills | Status: DC
  Filled 2024-02-15: qty 60, 30d supply, fill #0

## 2024-02-15 MED ORDER — METHYLPHENIDATE HCL ER (OSM) 18 MG PO TBCR
18.0000 mg | EXTENDED_RELEASE_TABLET | Freq: Every morning | ORAL | 0 refills | Status: DC
  Filled 2024-02-15 – 2024-02-16 (×2): qty 30, 30d supply, fill #0

## 2024-02-16 ENCOUNTER — Other Ambulatory Visit (HOSPITAL_BASED_OUTPATIENT_CLINIC_OR_DEPARTMENT_OTHER): Payer: Self-pay

## 2024-02-20 DIAGNOSIS — F1010 Alcohol abuse, uncomplicated: Secondary | ICD-10-CM | POA: Diagnosis not present

## 2024-02-20 DIAGNOSIS — F3289 Other specified depressive episodes: Secondary | ICD-10-CM | POA: Diagnosis not present

## 2024-02-20 DIAGNOSIS — F428 Other obsessive-compulsive disorder: Secondary | ICD-10-CM | POA: Diagnosis not present

## 2024-02-20 DIAGNOSIS — F900 Attention-deficit hyperactivity disorder, predominantly inattentive type: Secondary | ICD-10-CM | POA: Diagnosis not present

## 2024-02-20 DIAGNOSIS — F418 Other specified anxiety disorders: Secondary | ICD-10-CM | POA: Diagnosis not present

## 2024-02-26 ENCOUNTER — Other Ambulatory Visit (HOSPITAL_BASED_OUTPATIENT_CLINIC_OR_DEPARTMENT_OTHER): Payer: Self-pay

## 2024-02-27 DIAGNOSIS — F900 Attention-deficit hyperactivity disorder, predominantly inattentive type: Secondary | ICD-10-CM | POA: Diagnosis not present

## 2024-02-27 DIAGNOSIS — F428 Other obsessive-compulsive disorder: Secondary | ICD-10-CM | POA: Diagnosis not present

## 2024-02-27 DIAGNOSIS — F418 Other specified anxiety disorders: Secondary | ICD-10-CM | POA: Diagnosis not present

## 2024-02-27 DIAGNOSIS — F3289 Other specified depressive episodes: Secondary | ICD-10-CM | POA: Diagnosis not present

## 2024-02-27 DIAGNOSIS — F1010 Alcohol abuse, uncomplicated: Secondary | ICD-10-CM | POA: Diagnosis not present

## 2024-02-28 ENCOUNTER — Other Ambulatory Visit (HOSPITAL_BASED_OUTPATIENT_CLINIC_OR_DEPARTMENT_OTHER): Payer: Self-pay

## 2024-03-05 DIAGNOSIS — F1010 Alcohol abuse, uncomplicated: Secondary | ICD-10-CM | POA: Diagnosis not present

## 2024-03-05 DIAGNOSIS — F3289 Other specified depressive episodes: Secondary | ICD-10-CM | POA: Diagnosis not present

## 2024-03-05 DIAGNOSIS — F428 Other obsessive-compulsive disorder: Secondary | ICD-10-CM | POA: Diagnosis not present

## 2024-03-05 DIAGNOSIS — F418 Other specified anxiety disorders: Secondary | ICD-10-CM | POA: Diagnosis not present

## 2024-03-05 DIAGNOSIS — F900 Attention-deficit hyperactivity disorder, predominantly inattentive type: Secondary | ICD-10-CM | POA: Diagnosis not present

## 2024-03-12 DIAGNOSIS — F418 Other specified anxiety disorders: Secondary | ICD-10-CM | POA: Diagnosis not present

## 2024-03-12 DIAGNOSIS — F1010 Alcohol abuse, uncomplicated: Secondary | ICD-10-CM | POA: Diagnosis not present

## 2024-03-12 DIAGNOSIS — F3289 Other specified depressive episodes: Secondary | ICD-10-CM | POA: Diagnosis not present

## 2024-03-12 DIAGNOSIS — F428 Other obsessive-compulsive disorder: Secondary | ICD-10-CM | POA: Diagnosis not present

## 2024-03-12 DIAGNOSIS — F900 Attention-deficit hyperactivity disorder, predominantly inattentive type: Secondary | ICD-10-CM | POA: Diagnosis not present

## 2024-03-19 DIAGNOSIS — F418 Other specified anxiety disorders: Secondary | ICD-10-CM | POA: Diagnosis not present

## 2024-03-19 DIAGNOSIS — F428 Other obsessive-compulsive disorder: Secondary | ICD-10-CM | POA: Diagnosis not present

## 2024-03-19 DIAGNOSIS — F1010 Alcohol abuse, uncomplicated: Secondary | ICD-10-CM | POA: Diagnosis not present

## 2024-03-19 DIAGNOSIS — F900 Attention-deficit hyperactivity disorder, predominantly inattentive type: Secondary | ICD-10-CM | POA: Diagnosis not present

## 2024-03-19 DIAGNOSIS — F3289 Other specified depressive episodes: Secondary | ICD-10-CM | POA: Diagnosis not present

## 2024-03-26 DIAGNOSIS — F428 Other obsessive-compulsive disorder: Secondary | ICD-10-CM | POA: Diagnosis not present

## 2024-03-26 DIAGNOSIS — F418 Other specified anxiety disorders: Secondary | ICD-10-CM | POA: Diagnosis not present

## 2024-03-26 DIAGNOSIS — F900 Attention-deficit hyperactivity disorder, predominantly inattentive type: Secondary | ICD-10-CM | POA: Diagnosis not present

## 2024-03-26 DIAGNOSIS — F3289 Other specified depressive episodes: Secondary | ICD-10-CM | POA: Diagnosis not present

## 2024-03-26 DIAGNOSIS — F1010 Alcohol abuse, uncomplicated: Secondary | ICD-10-CM | POA: Diagnosis not present

## 2024-04-02 DIAGNOSIS — F900 Attention-deficit hyperactivity disorder, predominantly inattentive type: Secondary | ICD-10-CM | POA: Diagnosis not present

## 2024-04-02 DIAGNOSIS — F418 Other specified anxiety disorders: Secondary | ICD-10-CM | POA: Diagnosis not present

## 2024-04-02 DIAGNOSIS — F428 Other obsessive-compulsive disorder: Secondary | ICD-10-CM | POA: Diagnosis not present

## 2024-04-02 DIAGNOSIS — F3289 Other specified depressive episodes: Secondary | ICD-10-CM | POA: Diagnosis not present

## 2024-04-02 DIAGNOSIS — F1010 Alcohol abuse, uncomplicated: Secondary | ICD-10-CM | POA: Diagnosis not present

## 2024-04-13 ENCOUNTER — Other Ambulatory Visit (HOSPITAL_BASED_OUTPATIENT_CLINIC_OR_DEPARTMENT_OTHER): Payer: Self-pay

## 2024-05-08 ENCOUNTER — Other Ambulatory Visit (HOSPITAL_BASED_OUTPATIENT_CLINIC_OR_DEPARTMENT_OTHER): Payer: Self-pay

## 2024-05-08 ENCOUNTER — Encounter (HOSPITAL_BASED_OUTPATIENT_CLINIC_OR_DEPARTMENT_OTHER): Payer: Self-pay

## 2024-05-08 MED ORDER — BUPROPION HCL ER (XL) 150 MG PO TB24
150.0000 mg | ORAL_TABLET | Freq: Every morning | ORAL | 3 refills | Status: AC
  Filled 2024-05-08: qty 30, 30d supply, fill #0

## 2024-05-08 MED ORDER — METHYLPHENIDATE HCL 10 MG PO TABS
10.0000 mg | ORAL_TABLET | Freq: Two times a day (BID) | ORAL | 0 refills | Status: AC
  Filled 2024-05-08: qty 60, 30d supply, fill #0

## 2024-05-08 MED ORDER — FLUOXETINE HCL 40 MG PO CAPS: 40.0000 mg | ORAL_CAPSULE | Freq: Every morning | ORAL | 3 refills | Status: AC

## 2024-05-08 MED ORDER — METHYLPHENIDATE HCL ER (OSM) 18 MG PO TBCR
18.0000 mg | EXTENDED_RELEASE_TABLET | Freq: Every morning | ORAL | 0 refills | Status: AC
  Filled 2024-05-08: qty 30, 30d supply, fill #0

## 2024-05-08 MED ORDER — TRAZODONE HCL 100 MG PO TABS
100.0000 mg | ORAL_TABLET | Freq: Every day | ORAL | 3 refills | Status: AC
  Filled 2024-05-08: qty 30, 30d supply, fill #0

## 2024-05-20 ENCOUNTER — Ambulatory Visit: Admitting: Dermatology
